# Patient Record
Sex: Female | Born: 1935
Health system: Southern US, Community
[De-identification: ages and names within clinical notes are randomized; demographics above are authoritative.]

## PROBLEM LIST (undated history)

## (undated) DIAGNOSIS — I10 Essential (primary) hypertension: Secondary | ICD-10-CM

## (undated) DIAGNOSIS — E785 Hyperlipidemia, unspecified: Secondary | ICD-10-CM

## (undated) DIAGNOSIS — E079 Disorder of thyroid, unspecified: Secondary | ICD-10-CM

## (undated) HISTORY — PX: TONSILLECTOMY: SUR1361

## (undated) HISTORY — PX: BACK SURGERY: SHX140

---

## 2007-12-18 ENCOUNTER — Inpatient Hospital Stay (HOSPITAL_COMMUNITY): Admission: EM | Admit: 2007-12-18 | Discharge: 2007-12-21 | Payer: Self-pay | Admitting: Emergency Medicine

## 2010-06-03 NOTE — Discharge Summary (Signed)
Jill Curtis, Jill Curtis               ACCOUNT NO.:  [id number]   MEDICAL RECORD NO.:  [id number]          PATIENT TYPE:  INP   LOCATION:  1518                         FACILITY:  [facility]   PHYSICIAN:  Monte Fantasia, MD  DATE OF BIRTH:  [birth year]   DATE OF ADMISSION:  12/18/2007  DATE OF DISCHARGE:                               DISCHARGE SUMMARY   PRIMARY CARE PHYSICIAN:  Dr. Marlou Sa [facility],  telephone number is [phone], fax number is [phone].   DISCHARGE DIAGNOSES:  1. Partial small obstruction, resolved.  2. Ileus, which is resolved.  3. Nausea, vomiting and abdominal pain possibly secondary to number 1      and number 2, which is also resolved.  4. Hypertension.  5. Mild hyponatremia, which is resolved.   DISCHARGE MEDICATIONS:  1. Diltiazem 120 mg p.o. daily.  2. Levothyroxine 125 mg p.o. daily.  3. Niacin 1000 mg p.o. q.h.s.  4. Protonix 40 mg p.o. q. 12.  5. The patient received Pneumococcal vaccine on December 19, 2007.   HOSPITAL COURSE:  A 77 year old Caucasian lady.  The patient came into  the emergency room with complaints of nausea, vomiting and abdominal  discomfort which was epigastric in location and worsened in intensity.  The patient was evaluated by the gastroenterologist on admission as her  guaiac was positive.  The impression was she did have a resolving small  bowel obstruction as well as melena; however, she would need an urgent  endoscopy on admission.  The patient was started on Proton pump  inhibitor therapy and was recommended to transfuse p.r.n. on admission  in case of a hemoglobin drop.  The patient was monitored for her  hemoglobin which remained stable for the past 3 days.  The patient also  symptomatically felt better and no longer had abdominal pain.  The  patient at present also had small bowel feedings done which showed no  evidence of any small bowel obstruction and just mild small bowel edema.  The patient  at present is stable as per GI and can be discharged home.   RADIOLOGICAL INVESTIGATIONS:  Done during the hospital stay:  CT scan of the abdomen and pelvis done on December 18, 2007.  Impression:  1. Partial bowel obstruction.  The distal ileum is decompressed so      obstruction is at the level of mid to distal ileum.  Actual      transitions were not visualized.  2. No free intraperitoneal air.  3. A small amount of ascites.  4. Mildly enlarged uterus containing numerous enhancing fibroids.   Small bowel series done on December 20, 2007.  Impression:  1. No current evidence of small bowel obstruction.  2. Multiple abdominal loops of small bowel showing nonspecific bowel      wall thickening.  3. Cecum more in the midline position than normal that is unlikely to      be of clinical significance.   DISCHARGE LABS:  Total WBC count 6.6, hemoglobin 12.9, hematocrit 37.6,  platelet count 176.  Sodium 135, potassium 4, chloride 107, bicarb 23,  glucose  112, BUN 6, creatinine 0.6, calcium 8.2, TSH 0.212.  UA has been  negative.  Hemoccult blood done on December 20, 2007 has been negative.   ASSESSMENT/PLAN:  I plan to discharge the patient today with the  discharge medications as dictated above.  Recommend the patient to  follow up with her primary care physician in [facility] on Friday.  Nutrition evaluation has been done and explained.  Education has been  done and a nutrition sheet has been provided to the patient.  We will  discharge the patient today.      Monte Fantasia, MD  Electronically Signed     MP/MEDQ  D:  12/21/2007  T:  12/21/2007  Job:  [id number]   cc:   Dr. Jethro Bastos

## 2010-06-03 NOTE — Consult Note (Signed)
[name], Curtis               ACCOUNT NO.:  [id number]   MEDICAL RECORD NO.:  [id number]          PATIENT TYPE:  INP   LOCATION:  1518                         FACILITY:  [facility]   PHYSICIAN:  Petra Kuba, M.D.    DATE OF BIRTH:  [birth year]   DATE OF CONSULTATION:  12/19/2007  DATE OF DISCHARGE:                                 CONSULTATION   We are asked to see Jill Curtis today in consultation for melena and heme-  positivity by Dr. Tamsen Roers of [facility].   HISTORY OF PRESENT ILLNESS:  This is a 77 year old female who was  vacationing in [place], [place] when she developed constipation  last Wednesday.  Normally her bowel movements are very regular and each  morning.  On Saturday she developed nausea and abdominal pain and then  began vomiting Saturday night.  She is uncertain if there was blood in  her emesis.  On Sunday the nausea, vomiting and emesis continued and she  came to [facility].  Yesterday she noticed a small black  liquid stool and then again today she had an urgent black liquid stool  in larger amount.  Her last colonoscopy was 3 years ago.  At that time  she had diverticulosis and 3 polyps that were benign.  Since being here  in the hospital,she has had no further abdominal pain and her emesis has  stopped.  She tells me that normally she has no abdominal pain, no  heartburn or indigestion.  She has not been on any recent antibiotic.  She does not take NSAIDs.  She is undergoing a treatment to prevent skin  cancers on her face.  She says she has a propensity to develop skin  cancers.   PAST MEDICAL HISTORY:  Significant for back surgery, tonsillectomy and  no abdominal surgeries.  She has a history of hypothyroidism, mild  hypertension and a propensity to develop skin cancers.   CURRENT MEDICATIONS:  Include Synthroid, Tiazac, a multivitamin, niacin,  magnesium and CoQ10.   She has no known drug allergies.   REVIEW OF SYSTEMS:  This  appears to be a healthy 77 year old female who  describes herself as active.  She describes no shortness of breath,  palpitations or weight loss.  No recent illness.   SOCIAL HISTORY:  Negative for tobacco and recreational drugs.  She  drinks an occasional drink of alcohol.   FAMILY HISTORY:  Negative for colon cancer.   PHYSICAL EXAM:  She is alert, pleasant to speak with.  Her temperature  is 97.7.  Pulse 95.  Respirations 18.  Blood pressure is 121/69.  Heart  has a regular rate and rhythm.  LUNGS:  Clear to auscultation.  ABDOMEN:  Has hyperactive bowel sounds at the moment.  It is soft,  mildly tender in the lower quadrants bilaterally.   LABORATORIES:  Significant for a hemoglobin of 12.4.  This is a drop  from 14.8, probably mostly delusional.  Her white count was 15.2 on  admission, is now 12.9.  Hematocrit 35.8.  BMET is significant for a  sodium of 130, BUN 13, potassium 3.5, creatinine 0.74, glucose 125.  CT  scan done yesterday shows a partial small bowel obstruction, small  amount of intraabdominal ascites, obstruction at the level of the mid to  distal ileum.  She has uterine fibroids.   ASSESSMENT:  Dr. Vida Rigger has seen and examined the patient, collected  a history and reviewed her chart.  His impression is that she does have  a resolving small bowel obstruction as well as melena.  Do not see a  need for urgent endoscopy today.  We will monitor her hemoglobin and  hematocrit.  Possible endoscopy tomorrow if she does not improve, agree  with proton pump inhibitor therapy, transfuse p.r.n.  We will follow  with you.  Thanks very much for this consultation.      Stephani Police, PA    ______________________________  Petra Kuba, M.D.    MLY/MEDQ  D:  12/19/2007  T:  12/19/2007  Job:  [id number]   cc:   Petra Kuba, M.D.  Fax: [phone]

## 2010-06-03 NOTE — H&P (Signed)
Jill Curtis, Jill Curtis               ACCOUNT NO.:  [id number]   MEDICAL RECORD NO.:  [id number]          PATIENT TYPE:  INP   LOCATION:  1518                         [facility]ACILITY:  [facility]   PHYSICIAN:  Della Goo, M.D. DATE OF BIRTH:  09/22/1929   DATE OF ADMISSION:  12/18/2007  DATE OF DISCHARGE:                              HISTORY & PHYSICAL   PRIMARY CARE PHYSICIAN:  Unassigned.  The patient is visiting from  [place].   CHIEF COMPLAINT:  Nausea, vomiting.   HISTORY OF PRESENT ILLNESS:  This is a 77 year old female who presents  to the emergency department with complaints of severe nausea and  vomiting over the past two days.  She reports having constipation for  the past two days as well.  Denies having any diarrhea or loose stool  passage.  Denies having any hematemesis or bilious emesis passage.  She  denies having any fevers, chills or congestion, chest pain, shortness of  breath.  The patient does report having abdominal discomfort which is  epigastric in location.  She states that the abdominal pain discomfort  was rated at 10/10.  However, was relieved with medications.  Her nausea  was also relieved with medications that were administered in the  emergency department.   PAST MEDICAL HISTORY:  Significant for hypothyroidism, hypertension,  diverticulitis in the past, hyperlipidemia, and skin cancer.   PAST SURGICAL HISTORY:  History of a previous back surgery about 20  years ago.   MEDICATIONS:  1. Synthroid 0.125 mg p.o. daily.  2. Tiazac 20 mg one p.o. daily.  3. Niacin 1000 mg one p.o. daily.  4. Over-the-counter supplements.  5. Coenzyme Q-10.  6. Multivitamin.  7. Vitamin D.  8. Magnesium.   ALLERGIES:  NO KNOWN DRUG ALLERGIES.   SOCIAL HISTORY:  The patient is married, lives in [place].  She is a  nonsmoker, nondrinker.   FAMILY HISTORY:  Noncontributory.  A 14-point review of systems  performed and the pertinent positives are mentioned above.   PHYSICAL EXAMINATION:  GENERAL:  This is a thin 77 year old well-  developed female in discomfort but no acute distress.  VITAL SIGNS:  Temperature 98.5, blood pressure 155/69, heart rate  initially 101 now 62, respirations 18, O2 sat 96-97% on room air.  HEENT:  Examination normocephalic, atraumatic.  Positive facial erythema  along the forehead and malar area (the patient reports this is a skin  treatment she has been undergoing for skin cancer by her dermatologist).  Pupils are equally round reactive to light.  Extraocular movements are  intact.  Funduscopic benign.  Oropharynx is clear.  NECK:  Supple, full range of motion.  No thyromegaly, adenopathy,  jugular venous distention.  CARDIOVASCULAR:  Regular rate and rhythm.  No murmurs, gallops or rubs.  LUNGS:  Clear to auscultation bilaterally.  ABDOMEN:  Decreased bowel sounds, soft, nontender, nondistended.  No  hepatosplenomegaly.  EXTREMITIES:  Without cyanosis, clubbing or edema.  NEUROLOGIC:  Examination nonfocal.   LABORATORY STUDIES:  White blood cell count 15.2, hemoglobin 14.8,  hematocrit 43.1, platelets 218.  Sodium 133, potassium 3.7, chloride 98,  bicarb  25, BUN 16, creatinine 0.81 and glucose 136, lipase 22.  Urinalysis negative.  CT scan of the abdomen and pelvis reveals dilated  small-bowel loops.  Small amount of ascites, small hiatal hernia a small  low attenuation focus mentioned in the anterior segment of the right  lobe of the liver.  CT scan of the pelvis does revealed a mildly  enlarged uterus with numerous enhancing fibroids, small amount of  ascites in the pelvis and mild degenerative changes.   ASSESSMENT:  A 77 year old female being admitted with:  1. Partial small-bowel obstruction.  2. Ileus.  3. Nausea and vomiting secondary #1 and #2.  4. Abdominal pain.  5. Hypertension.  6. Mild hyponatremia.   PLAN:  The patient will be admitted and placed on IV fluids and clear  liquids and bowel rest.   Antiemetic therapy and pain control therapy  have also been ordered along with IV Reglan therapy q.6 h.  The patient  will be monitored for further changes and a general surgery consultation  will be considered.  The patient will be placed on DVT and GI  prophylaxis as well and continue on her regular medications, withhold  parameters for hypotension with her blood pressure medication.      Della Goo, M.D.  Electronically Signed     HJ/MEDQ  D:  12/19/2007  T:  12/20/2007  Job:  [id number]

## 2010-10-21 LAB — COMPREHENSIVE METABOLIC PANEL
ALT: 19 U/L (ref 0–35)
Calcium: 9.3 mg/dL (ref 8.4–10.5)
Chloride: 98 mEq/L (ref 96–112)
Creatinine, Ser: 0.81 mg/dL (ref 0.4–1.2)
Potassium: 3.7 mEq/L (ref 3.5–5.1)
Sodium: 133 mEq/L — ABNORMAL LOW (ref 135–145)
Total Bilirubin: 1 mg/dL (ref 0.3–1.2)
Total Protein: 6.5 g/dL (ref 6.0–8.3)

## 2010-10-21 LAB — DIFFERENTIAL
Basophils Absolute: 0.1 10*3/uL (ref 0.0–0.1)
Basophils Relative: 1 % (ref 0–1)
Eosinophils Absolute: 0 10*3/uL (ref 0.0–0.7)
Eosinophils Absolute: 0 10*3/uL (ref 0.0–0.7)
Lymphocytes Relative: 16 % (ref 12–46)
Lymphocytes Relative: 9 % — ABNORMAL LOW (ref 12–46)
Lymphs Abs: 2.1 10*3/uL (ref 0.7–4.0)
Monocytes Absolute: 1 10*3/uL (ref 0.1–1.0)
Monocytes Relative: 4 % (ref 3–12)
Neutro Abs: 13.3 10*3/uL — ABNORMAL HIGH (ref 1.7–7.7)

## 2010-10-21 LAB — CBC
HCT: 35.8 % — ABNORMAL LOW (ref 36.0–46.0)
HCT: 43.1 % (ref 36.0–46.0)
Hemoglobin: 12.4 g/dL (ref 12.0–15.0)
Hemoglobin: 14.8 g/dL (ref 12.0–15.0)
MCHC: 34.2 g/dL (ref 30.0–36.0)
MCHC: 34.7 g/dL (ref 30.0–36.0)
MCV: 97 fL (ref 78.0–100.0)
MCV: 97.8 fL (ref 78.0–100.0)
Platelets: 218 10*3/uL (ref 150–400)
RBC: 4.44 MIL/uL (ref 3.87–5.11)
RDW: 12.6 % (ref 11.5–15.5)

## 2010-10-21 LAB — URINALYSIS, ROUTINE W REFLEX MICROSCOPIC
Bilirubin Urine: NEGATIVE
Glucose, UA: NEGATIVE mg/dL
Hgb urine dipstick: NEGATIVE
Ketones, ur: 15 mg/dL — AB
Nitrite: NEGATIVE
Protein, ur: NEGATIVE mg/dL
pH: 6.5 (ref 5.0–8.0)

## 2010-10-21 LAB — URINE CULTURE
Colony Count: NO GROWTH
Culture: NO GROWTH

## 2010-10-21 LAB — CROSSMATCH
ABO/RH(D): A POS
Antibody Screen: NEGATIVE

## 2010-10-21 LAB — BASIC METABOLIC PANEL
Calcium: 8.1 mg/dL — ABNORMAL LOW (ref 8.4–10.5)
Chloride: 102 mEq/L (ref 96–112)
GFR calc non Af Amer: >60 mL/min (ref >60)
Glucose, Bld: 125 mg/dL — ABNORMAL HIGH (ref 70–99)

## 2010-10-21 LAB — LIPASE, BLOOD: Lipase: 22 U/L (ref 11–59)

## 2010-10-24 LAB — CBC
MCHC: 34.3 g/dL (ref 30.0–36.0)
MCV: 98.4 fL (ref 78.0–100.0)
Platelets: 176 10*3/uL (ref 150–400)
RDW: 13 % (ref 11.5–15.5)
WBC: 6.6 10*3/uL (ref 4.0–10.5)

## 2010-10-24 LAB — BASIC METABOLIC PANEL
BUN: 6 mg/dL (ref 6–23)
Creatinine, Ser: 0.64 mg/dL (ref 0.4–1.2)
GFR calc Af Amer: >60 mL/min (ref >60)
Glucose, Bld: 112 mg/dL — ABNORMAL HIGH (ref 70–99)
Sodium: 135 mEq/L (ref 135–145)

## 2010-10-24 LAB — HEMOGLOBIN AND HEMATOCRIT, BLOOD
HCT: 37.6 % (ref 36.0–46.0)
HCT: 38.7 % (ref 36.0–46.0)
Hemoglobin: 11.7 g/dL — ABNORMAL LOW (ref 12.0–15.0)
Hemoglobin: 12.9 g/dL (ref 12.0–15.0)
Hemoglobin: 13 g/dL (ref 12.0–15.0)

## 2010-10-24 LAB — OCCULT BLOOD X 1 CARD TO LAB, STOOL: Fecal Occult Bld: NEGATIVE

## 2012-01-19 ENCOUNTER — Encounter (HOSPITAL_COMMUNITY): Payer: Self-pay | Admitting: *Deleted

## 2012-01-19 ENCOUNTER — Emergency Department (HOSPITAL_COMMUNITY)
Admission: EM | Admit: 2012-01-19 | Discharge: 2012-01-19 | Disposition: A | Discharge status: Home / Self Care | Payer: Medicare Other | Attending: Emergency Medicine | Admitting: Emergency Medicine

## 2012-01-19 ENCOUNTER — Emergency Department (HOSPITAL_COMMUNITY): Payer: Medicare Other

## 2012-01-19 DIAGNOSIS — Z7982 Long term (current) use of aspirin: Secondary | ICD-10-CM | POA: Insufficient documentation

## 2012-01-19 DIAGNOSIS — E079 Disorder of thyroid, unspecified: Secondary | ICD-10-CM | POA: Insufficient documentation

## 2012-01-19 DIAGNOSIS — R1031 Right lower quadrant pain: Secondary | ICD-10-CM | POA: Insufficient documentation

## 2012-01-19 DIAGNOSIS — I10 Essential (primary) hypertension: Secondary | ICD-10-CM | POA: Insufficient documentation

## 2012-01-19 DIAGNOSIS — N200 Calculus of kidney: Secondary | ICD-10-CM | POA: Insufficient documentation

## 2012-01-19 DIAGNOSIS — Z79899 Other long term (current) drug therapy: Secondary | ICD-10-CM | POA: Insufficient documentation

## 2012-01-19 DIAGNOSIS — E785 Hyperlipidemia, unspecified: Secondary | ICD-10-CM | POA: Insufficient documentation

## 2012-01-19 HISTORY — DX: Essential (primary) hypertension: I10

## 2012-01-19 HISTORY — DX: Hyperlipidemia, unspecified: E78.5

## 2012-01-19 HISTORY — DX: Disorder of thyroid, unspecified: E07.9

## 2012-01-19 LAB — COMPREHENSIVE METABOLIC PANEL
ALT: 20 U/L (ref 0–35)
AST: 25 U/L (ref 0–37)
Alkaline Phosphatase: 55 U/L (ref 39–117)
CO2: 23 mEq/L (ref 19–32)
Calcium: 9.1 mg/dL (ref 8.4–10.5)
GFR calc non Af Amer: 82 mL/min — ABNORMAL LOW (ref >90)
Glucose, Bld: 88 mg/dL (ref 70–99)
Potassium: 3.7 mEq/L (ref 3.5–5.1)
Sodium: 129 mEq/L — ABNORMAL LOW (ref 135–145)
Total Protein: 6.8 g/dL (ref 6.0–8.3)

## 2012-01-19 LAB — URINALYSIS, ROUTINE W REFLEX MICROSCOPIC
Leukocytes, UA: NEGATIVE
Nitrite: NEGATIVE
Protein, ur: NEGATIVE mg/dL
Specific Gravity, Urine: 1.009 (ref 1.005–1.030)
Urobilinogen, UA: 0.2 mg/dL (ref 0.0–1.0)

## 2012-01-19 LAB — CBC WITH DIFFERENTIAL/PLATELET
Basophils Absolute: 0.1 10*3/uL (ref 0.0–0.1)
Eosinophils Relative: 1 % (ref 0–5)
Lymphocytes Relative: 31 % (ref 12–46)
Lymphs Abs: 3.4 10*3/uL (ref 0.7–4.0)
MCV: 92.7 fL (ref 78.0–100.0)
Neutrophils Relative %: 60 % (ref 43–77)
Platelets: 161 10*3/uL (ref 150–400)
RBC: 4.25 MIL/uL (ref 3.87–5.11)
RDW: 12.6 % (ref 11.5–15.5)
WBC: 10.8 10*3/uL — ABNORMAL HIGH (ref 4.0–10.5)

## 2012-01-19 LAB — URINE MICROSCOPIC-ADD ON

## 2012-01-19 MED ORDER — MORPHINE SULFATE 2 MG/ML IJ SOLN
2.0000 mg | Freq: Once | INTRAMUSCULAR | Status: AC
  Administered 2012-01-19: 2 mg via INTRAVENOUS
  Filled 2012-01-19: qty 1

## 2012-01-19 MED ORDER — OXYCODONE-ACETAMINOPHEN 5-325 MG PO TABS: 2.0000 | ORAL_TABLET | ORAL | Status: DC | PRN

## 2012-01-19 MED ORDER — TAMSULOSIN HCL 0.4 MG PO CAPS: 0.4000 mg | ORAL_CAPSULE | Freq: Every day | ORAL | Status: DC

## 2012-01-19 MED ORDER — KETOROLAC TROMETHAMINE 30 MG/ML IJ SOLN
15.0000 mg | Freq: Once | INTRAMUSCULAR | Status: AC
  Administered 2012-01-19: 15 mg via INTRAVENOUS
  Filled 2012-01-19: qty 1

## 2012-01-19 MED ORDER — MORPHINE SULFATE 4 MG/ML IJ SOLN
4.0000 mg | Freq: Once | INTRAMUSCULAR | Status: AC
  Administered 2012-01-19: 4 mg via INTRAVENOUS
  Filled 2012-01-19: qty 1

## 2012-01-19 MED ORDER — ONDANSETRON HCL 4 MG/2ML IJ SOLN
4.0000 mg | Freq: Once | INTRAMUSCULAR | Status: AC
  Administered 2012-01-19: 4 mg via INTRAVENOUS
  Filled 2012-01-19: qty 2

## 2012-01-19 MED ORDER — MORPHINE SULFATE 2 MG/ML IJ SOLN: 2.0000 mg | Freq: Once | INTRAMUSCULAR | Status: DC

## 2012-01-19 NOTE — ED Provider Notes (Addendum)
History     CSN: [id number]  Arrival date & time 01/19/12  1513   First MD Initiated Contact with Patient 01/19/12 1719      Chief Complaint  Patient presents with  . Flank Pain    (Consider location/radiation/quality/duration/timing/severity/associated sxs/prior treatment) Patient is a 78 y.o. female presenting with flank pain. The history is provided by the patient.  Flank Pain This is a new problem. The current episode started 6 to 12 hours ago. Episode frequency: every several min. The problem has not changed since onset.Associated symptoms include abdominal pain. Pertinent negatives include no chest pain and no shortness of breath. Associated symptoms comments: No fever, dysuria. States that she is unsure if the pain has moved since it started at 3 AM this morning.. Nothing aggravates the symptoms. Nothing relieves the symptoms. Treatments tried: 2 aleve. The treatment provided no relief.    Past Medical History  Diagnosis Date  . Thyroid disease   . Hypertension   . Hyperlipidemia     Past Surgical History  Procedure Date  . Tonsillectomy   . Back surgery     History reviewed. No pertinent family history.  History  Substance Use Topics  . Smoking status: Never Smoker   . Smokeless tobacco: Not on file  . Alcohol Use: Yes    OB History    Grav Para Term Preterm Abortions TAB SAB Ect Mult Living                  Review of Systems  Constitutional: Negative for fever.  Respiratory: Negative for cough and shortness of breath.   Cardiovascular: Negative for chest pain.  Gastrointestinal: Positive for abdominal pain. Negative for nausea, vomiting and diarrhea.  Genitourinary: Positive for flank pain. Negative for dysuria and vaginal discharge.  All other systems reviewed and are negative.    Allergies  Review of patient's allergies indicates no known allergies.  Home Medications   Current Outpatient Rx  Name  Route  Sig  Dispense  Refill  . ASPIRIN 325  MG PO TABS   Oral   Take 325 mg by mouth at bedtime.         Marland Kitchen VITAMIN D 1000 UNITS PO TABS   Oral   Take 1,000 Units by mouth daily.         Marland Kitchen EZETIMIBE 10 MG PO TABS   Oral   Take 10 mg by mouth at bedtime.         Marland Kitchen LEVOTHYROXINE SODIUM 88 MCG PO TABS   Oral   Take 88 mcg by mouth daily.         Marland Kitchen MAGNESIUM 250 MG PO TABS   Oral   Take 1 tablet by mouth daily.         . NEBIVOLOL HCL 5 MG PO TABS   Oral   Take 5 mg by mouth daily.           BP 156/75  Pulse 67  Temp 98 F (36.7 C) (Oral)  Resp 16  SpO2 98%  Physical Exam  Nursing note and vitals reviewed. Constitutional: She is oriented to person, place, and time. She appears well-developed and well-nourished. No distress.  HENT:  Head: Normocephalic and atraumatic.  Mouth/Throat: Oropharynx is clear and moist.  Eyes: Conjunctivae normal and EOM are normal. Pupils are equal, round, and reactive to light.  Neck: Normal range of motion. Neck supple.  Cardiovascular: Normal rate, regular rhythm and intact distal pulses.   No murmur heard.  Pulmonary/Chest: Effort normal and breath sounds normal. No respiratory distress. She has no wheezes. She has no rales.  Abdominal: Soft. Normal appearance. She exhibits no distension. There is tenderness in the right lower quadrant. There is CVA tenderness. There is no rebound and no guarding.         Right flank pain  Musculoskeletal: Normal range of motion. She exhibits no edema and no tenderness.  Neurological: She is alert and oriented to person, place, and time.  Skin: Skin is warm and dry. No rash noted. No erythema.  Psychiatric: She has a normal mood and affect. Her behavior is normal.    ED Course  Procedures (including critical care time)  Labs Reviewed  URINALYSIS, ROUTINE W REFLEX MICROSCOPIC - Abnormal; Notable for the following:    Hgb urine dipstick TRACE (*)     Ketones, ur TRACE (*)     All other components within normal limits  CBC WITH  DIFFERENTIAL - Abnormal; Notable for the following:    WBC 10.8 (*)     All other components within normal limits  COMPREHENSIVE METABOLIC PANEL - Abnormal; Notable for the following:    Sodium 129 (*)     GFR calc non Af Amer 82 (*)     All other components within normal limits  URINE MICROSCOPIC-ADD ON   Ct Abdomen Pelvis Wo Contrast  01/19/2012  *RADIOLOGY REPORT*  Clinical Data: Right flank pain.  Question kidney stone.  CT ABDOMEN AND PELVIS WITHOUT CONTRAST  Technique:  Multidetector CT imaging of the abdomen and pelvis was performed following the standard protocol without intravenous contrast.  Comparison: 12/18/2007  Findings: 3 tiny pulmonary nodules are seen in the lung bases, but these are unchanged since the study of over 4 years ago, consistent with benign disease.  No focal abnormalities seen in the liver or spleen.  The stomach, duodenum, gallbladder, and adrenal glands are normal in appearance. Slight prominence of the main pancreatic duct is unchanged. Kidneys are unremarkable.  No secondary change in either kidney. No renal stones.  No evidence for stones in either ureter or the urinary bladder.  No abdominal aortic aneurysm.  There is no free fluid or lymphadenopathy in the abdomen.  Imaging through the pelvis shows no free intraperitoneal fluid. There is no pelvic sidewall lymphadenopathy.  Bladder is unremarkable.  Uterus has normal imaging features.  There is no adnexal mass.  Diverticular changes are seen in the sigmoid colon without diverticulitis.  The terminal ileum is normal. The appendix is not visualized, but there is no edema or inflammation in the region of the cecum.  Bilateral growing hernias contain only fat.  Bone windows show no worrisome lytic or sclerotic osseous abnormality.  Superior endplate compression deformity of L1 is stable.  IMPRESSION: No acute findings in the abdomen or pelvis.  Specifically, no evidence to explain the patient's history of right flank pain.    Original Report Authenticated By: Kennith Center, M.D.      1. Kidney stone       MDM   Pt with symptoms consistent with kidney stone.  Denies infectious sx, or GI symptoms.  Low concern for diverticulitis and no risk factors or history suggestive of AAA.  HD stable and normal pulses.  No hx suggestive of GU source (discharge).  Will hydrate, treat pain and ensure no infection with UA, CBC, CMP and will get stone study to further eval.  6:26 PM UA with signs of trace hb and no infection.  CBC  with mild leukocytosis of 11,000.  7:10 PM CMP within normal limits. CT stone study showed a normal aorta. No signs of appendicitis and no findings of kidney stones. Unclear what is causing the patient's pain however with trace blood in her urine it could be a passed stone. There's no sign of infection and no ovarian cyst or concerns. Bone lesions. Will have patient followup with PCP if pain continues.  7:40 PM On reevaluation patient was still having pain so she was given 2 more milligrams of morphine and will recheck. Still feel patient's symptoms are most consistent with a kidney stone and with the rest of her imaging being negative will treat as such.  8:55 PM Pt still having pain and given toradol and morphine  9:48 PM Moderate improvement with pain after last dose.  Will d/c home with meds and f/u.  Gwyneth Sprout, MD 01/19/12 1911  Gwyneth Sprout, MD 01/19/12 1941  Gwyneth Sprout, MD 01/19/12 4098  Gwyneth Sprout, MD 01/19/12 1191  Gwyneth Sprout, MD 01/19/12 2149

## 2012-01-19 NOTE — ED Notes (Addendum)
Pt reports right lower abdominal pain/ hip pain. intermitement pain 10/10 since 0200 this am. Denies nausea, vomiting, dysuria. No hx of kidney stones.

## 2014-05-29 IMAGING — CT CT ABD-PELV W/O CM
1 series · 14 of 19 positions shown, 19 images · non-contrast
Comparison: 12/18/2007

CLINICAL DATA: Right flank pain.  Question kidney stone.

CT ABDOMEN AND PELVIS WITHOUT CONTRAST
TECHNIQUE: Multidetector CT imaging of the abdomen and pelvis was
performed following the standard protocol without intravenous
contrast.

[Series 6: lung · axial · 0.74mm/px · z∈[-89,-9]mm · 14 of 19 slices shown, 19 images]
[im 2/19  soft-tissue]
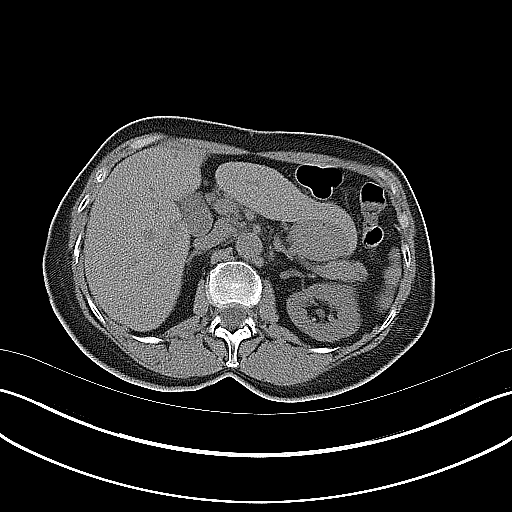
[im 2/19  bone]
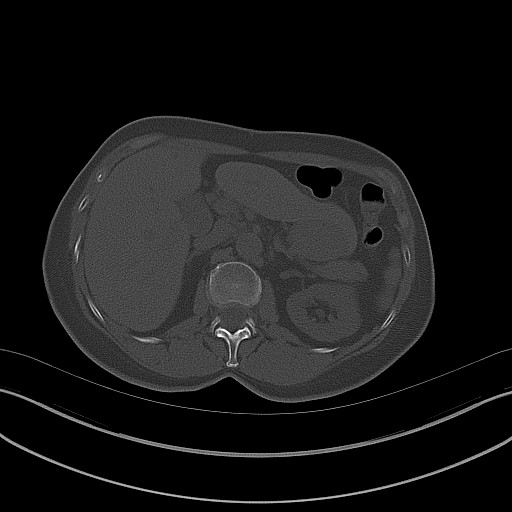
[im 3/19  soft-tissue]
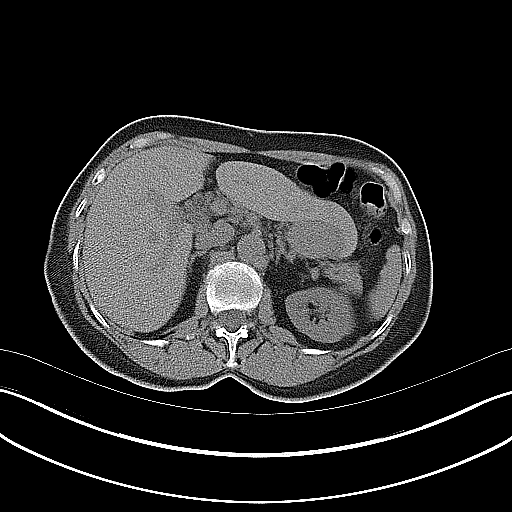
[im 5/19  soft-tissue]
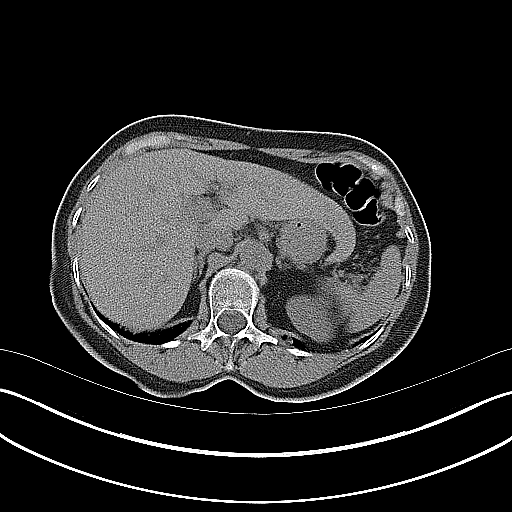
[im 6/19  soft-tissue]
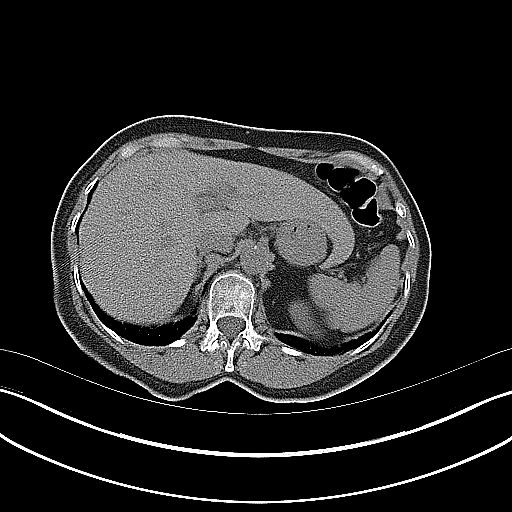
[im 7/19  soft-tissue]
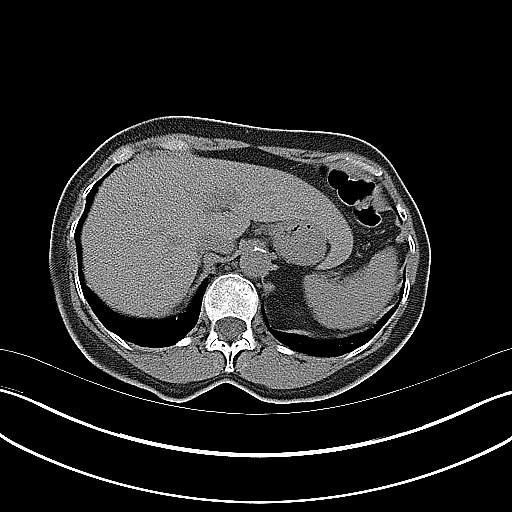
[im 9/19  soft-tissue]
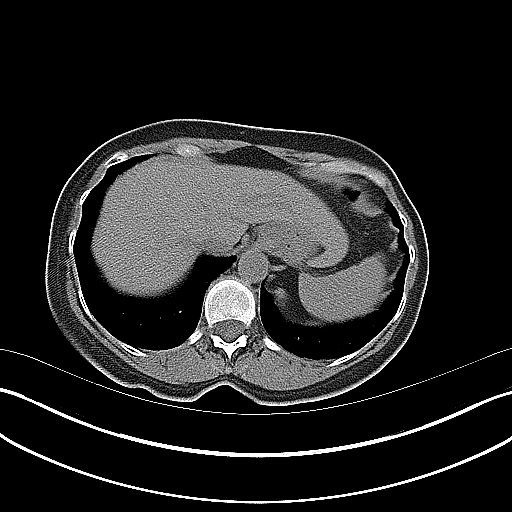
[im 10/19  soft-tissue]
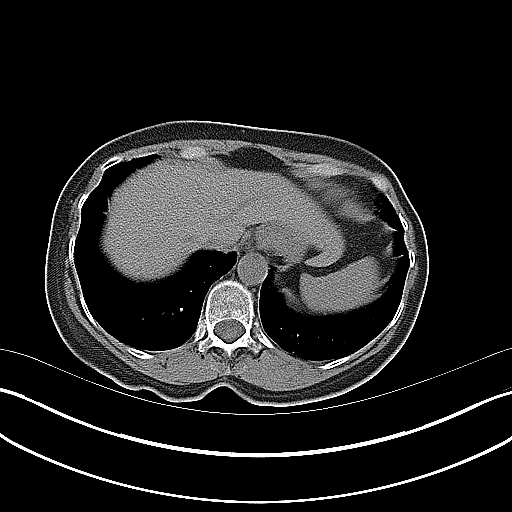
[im 11/19  soft-tissue]
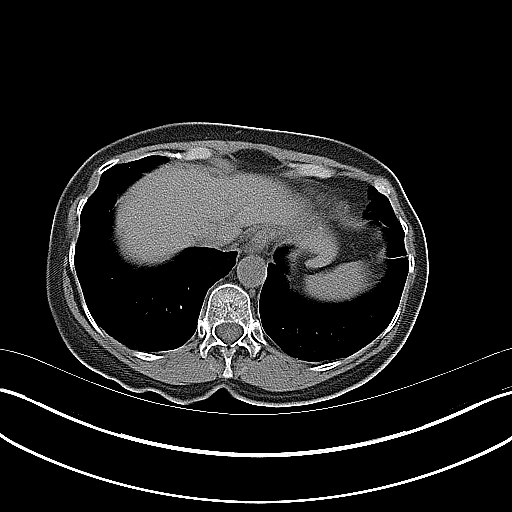
[im 13/19  soft-tissue]
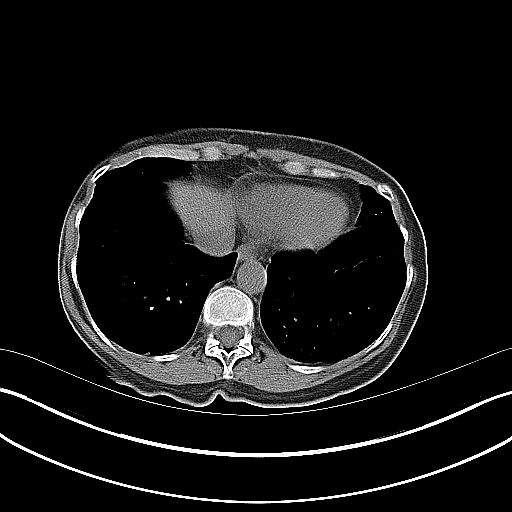
[im 13/19  bone]
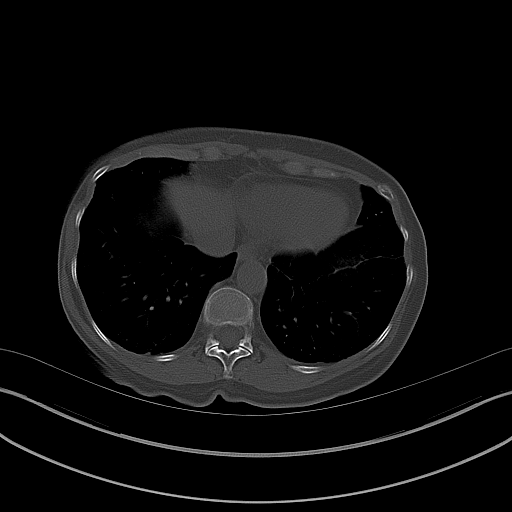
[im 14/19  soft-tissue]
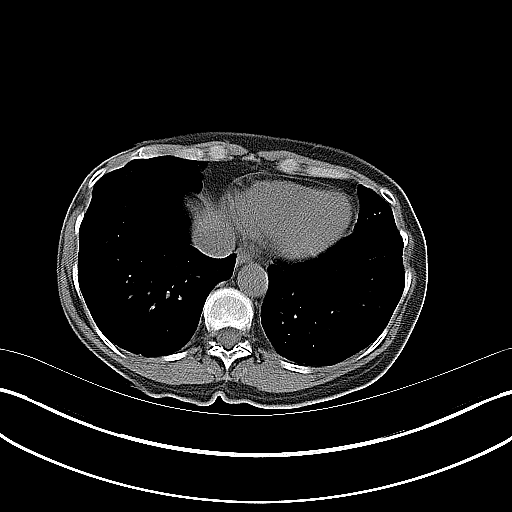
[im 15/19  soft-tissue]
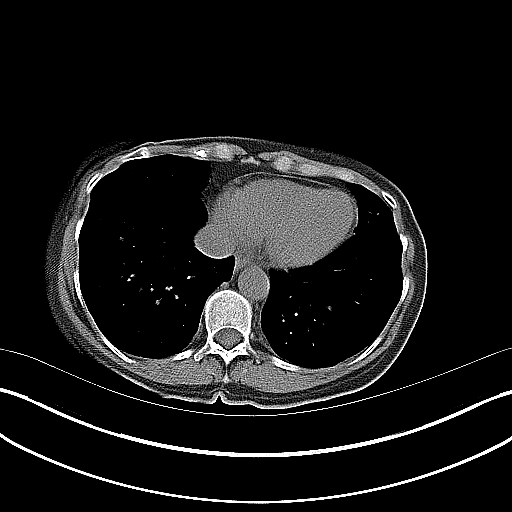
[im 15/19  lung]
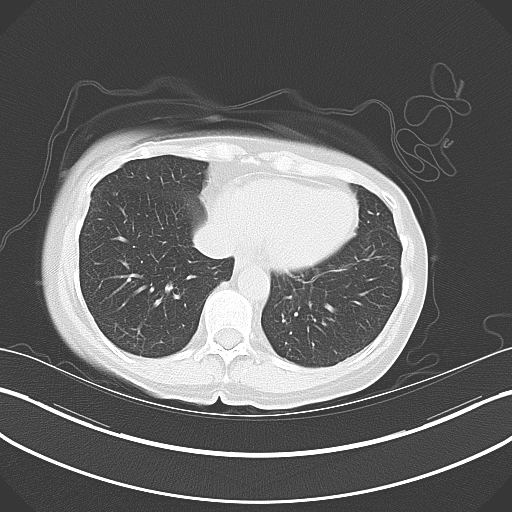
[im 16/19  lung]
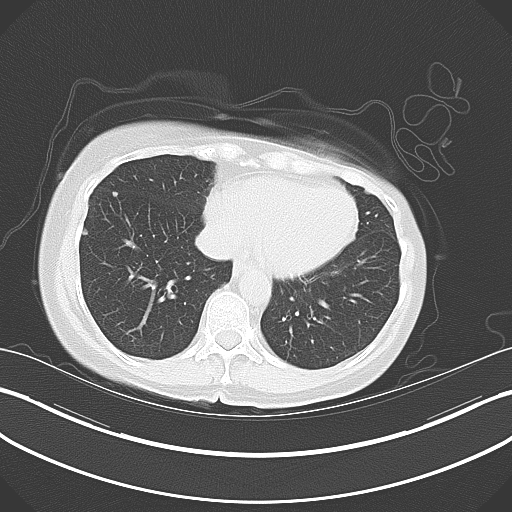
[im 17/19  soft-tissue]
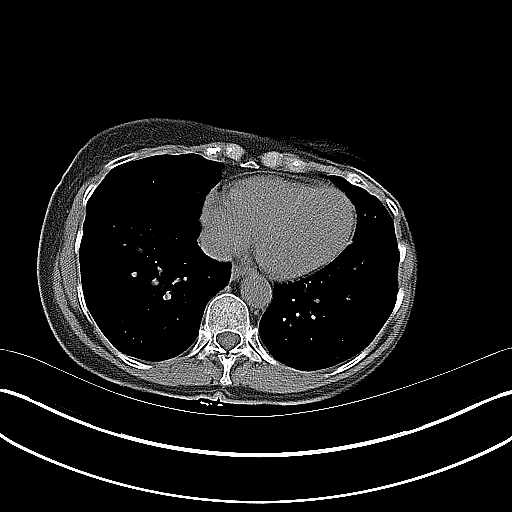
[im 17/19  lung]
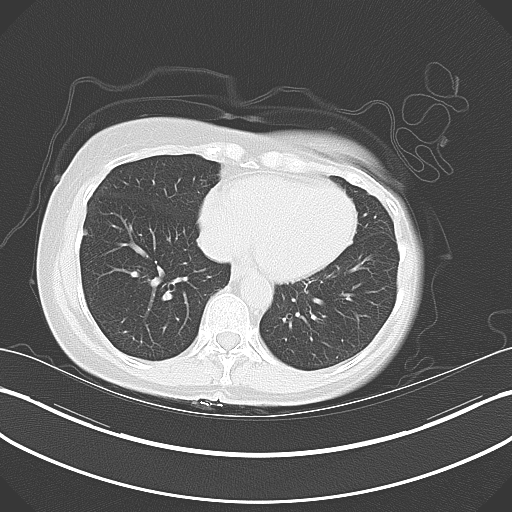
[im 18/19  soft-tissue]
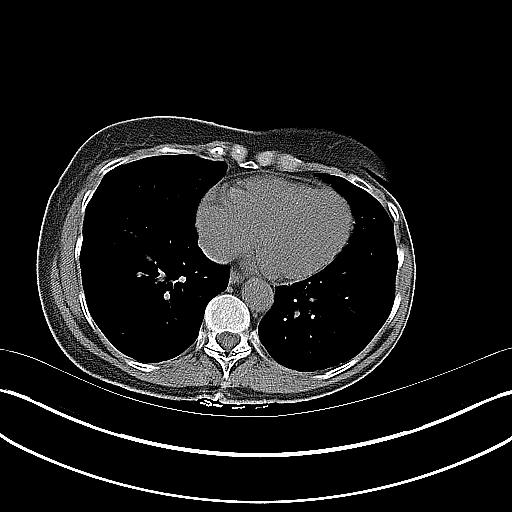
[im 18/19  lung]
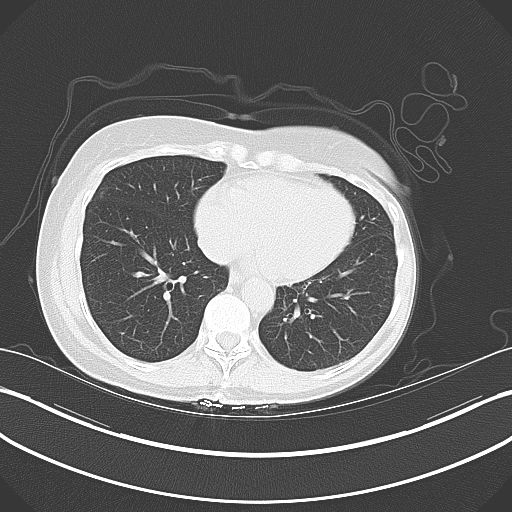

[14 of 19 positions shown; findings below may reference images not displayed]

FINDINGS: 3 tiny pulmonary nodules are seen in the lung bases, but
these are unchanged since the study of over 4 years ago, consistent
with benign disease.

No focal abnormalities seen in the liver or spleen.  The stomach,
duodenum, gallbladder, and adrenal glands are normal in appearance.
Slight prominence of the main pancreatic duct is unchanged.
Kidneys are unremarkable.  No secondary change in either kidney.
No renal stones.  No evidence for stones in either ureter or the
urinary bladder.

No abdominal aortic aneurysm.  There is no free fluid or
lymphadenopathy in the abdomen.

Imaging through the pelvis shows no free intraperitoneal fluid.
There is no pelvic sidewall lymphadenopathy.  Bladder is
unremarkable.  Uterus has normal imaging features.  There is no
adnexal mass.

Diverticular changes are seen in the sigmoid colon without
diverticulitis.  The terminal ileum is normal. The appendix is not
visualized, but there is no edema or inflammation in the region of
the cecum.

Bilateral growing hernias contain only fat.  Bone windows show no
worrisome lytic or sclerotic osseous abnormality.  Superior
endplate compression deformity of L1 is stable.
IMPRESSION: No acute findings in the abdomen or pelvis.  Specifically, no
evidence to explain the patient's history of right flank pain.

## 2018-11-07 ENCOUNTER — Other Ambulatory Visit: Payer: Self-pay

## 2018-11-07 DIAGNOSIS — Z20822 Contact with and (suspected) exposure to covid-19: Secondary | ICD-10-CM

## 2018-11-09 LAB — NOVEL CORONAVIRUS, NAA: SARS-CoV-2, NAA: DETECTED — AB

## 2019-07-03 ENCOUNTER — Telehealth: Payer: Self-pay | Admitting: Cardiology

## 2019-07-03 NOTE — Telephone Encounter (Signed)
I attempted to contact patient on 07/03/19 to schedule appointment from patients referral to Dr.Crenshaw. The patient didn't answer so I left message for patient to return call to get appointment scheduled.

## 2019-09-01 NOTE — Progress Notes (Signed)
Cardiology Office Note   Date:  09/04/2019   ID:  Liona Wengert, DOB 18-Aug-1929, MRN [id number]  PCP:  Eartha Inch, MD  Cardiologist:   Anastasios Melander [place], MD   Chief Complaint  Patient presents with  . Fatigue      History of Present Illness: Jill Curtis is a 86 y.o. female who is seen at the request of Dr Anne Hahn for complaints of visual changes, fatigue,  and left arm numbness. She has a history of HTN, HLD, and thyroid disease. She had prior event monitor in 2019 while in [place] showing a few runs of SVT. This was part of a work up for possible TIAs. She had remote stress testing in 2013 that was normal.  She tells me today she is not sure why she is here. It was recommended by her Neurologist. She reports that for several years she has had some dizziness. She has felt fatigued. She denies any palpitations, syncope, chest pain or SOB. She is very active and walks at least 2 miles per day. This past weekend she was in the [place] and took 2 long hikes. She has noted some visual changes in her right eye. Normal eye exam. MRI showed some chronic white matter changes. 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     Past Medical History:  Diagnosis Date  . Hyperlipidemia   . Hypertension   . Thyroid disease     Past Surgical History:  Procedure Laterality Date  . BACK SURGERY    . TONSILLECTOMY       Current Outpatient Medications  Medication Sig Dispense Refill  . cetirizine (ZYRTEC) 10 MG tablet Take 10 mg by mouth daily.    . cholecalciferol (VITAMIN D) 1000 UNITS tablet Take 1,000 Units by mouth daily.    Marland Kitchen ezetimibe (ZETIA) 10 MG tablet Take 10 mg by mouth at bedtime.    Marland Kitchen levothyroxine (SYNTHROID, LEVOTHROID) 88 MCG tablet Take 88 mcg by mouth daily.    Marland Kitchen lisinopril (ZESTRIL) 5 MG tablet Take by mouth.    . Magnesium 250 MG TABS Take 1 tablet by mouth daily.    . nebivolol (BYSTOLIC) 5 MG tablet Take 5 mg by mouth daily.    . rosuvastatin (CRESTOR) 5 MG tablet  Take by mouth.    . Tamsulosin HCl (FLOMAX) 0.4 MG CAPS Take 1 capsule (0.4 mg total) by mouth daily after supper. 5 capsule 0  . aspirin EC 81 MG tablet Take 1 tablet (81 mg total) by mouth daily. Swallow whole. 90 tablet 3   No current facility-administered medications for this visit.    Allergies:   Patient has no known allergies.    Social History:  The patient  reports that she has never smoked. She has never used smokeless tobacco. She reports current alcohol use of about 2.0 standard drinks of alcohol per week. She reports that she does not use drugs.   Family History:  The patient's family history is negative for CAD.   ROS:  Please see the history of present illness.   Otherwise, review of systems are positive for none.   All other systems are reviewed and negative.    PHYSICAL EXAM: VS:  BP (!) 168/94   Pulse 71   Ht 5' (1.524 m)   Wt 113 lb 9.6 oz (51.5 kg)   SpO2 96%   BMI 22.19 kg/m  , BMI Body mass index is 22.19 kg/m. GEN: Well nourished, well developed, WF appears younger than stated  age. in no acute distress  HEENT: normal  Neck: no JVD, carotid bruits, or masses Cardiac: RRR; no murmurs, rubs, or gallops,no edema  Respiratory:  clear to auscultation bilaterally, normal work of breathing GI: soft, nontender, nondistended, + BS MS: no deformity or atrophy  Skin: warm and dry, no rash Neuro:  Strength and sensation are intact Psych: euthymic mood, full affect   EKG:  EKG is ordered today. The ekg ordered today demonstrates NSR rate 71. Nonspecific TWA. I have personally reviewed and interpreted this study.    Recent Labs: No results found for requested labs within last 8760 hours.    Lipid Panel No results found for: CHOL, TRIG, HDL, CHOLHDL, VLDL, LDLCALC, LDLDIRECT    Wt Readings from Last 3 Encounters:  09/04/19 113 lb 9.6 oz (51.5 kg)    Labs dated 05/31/19: albumin 2.4, otherwise CMET normal. TSH 0.373. plts 52K otherwise CBC normal. Noted  platelet aggregation.  Dated 07/14/18: cholesterol 140, triglycerides 49, HDL 88, LDL 42.    Other studies Reviewed: Additional studies/ records that were reviewed today include: none Review of the above records demonstrates: none   ASSESSMENT AND PLAN:  1.  Fatigue. Nonspecific. Nothing on history or exam to indicate a cardiac etiology. No ischemic symptoms. She is very active.  2. Dizziness chronic. I request the results of event monitor from 2019.  3. HTN well controlled. 4. HLD well controlled.  Plan; I do not recommend further cardiac evaluation at this time. Exam and Ecg are benign. No significant findings to suggest she is having coronary ischemia. Will  Follow up prn.   Current medicines are reviewed at length with the patient today.  The patient does not have concerns regarding medicines.  The following changes have been made:  no change  Labs/ tests ordered today include: none  Orders Placed This Encounter  Procedures  . EKG 12-Lead     Disposition:   FU with me PRN  Signed, Brittnae Aschenbrenner [place], MD  09/04/2019 11:50 AM    [facility] [address] [place], [place], [address] Phone [phone], Fax [phone]

## 2019-09-04 ENCOUNTER — Other Ambulatory Visit: Payer: Self-pay

## 2019-09-04 ENCOUNTER — Ambulatory Visit (INDEPENDENT_AMBULATORY_CARE_PROVIDER_SITE_OTHER): Payer: Medicare Other | Admitting: Cardiology

## 2019-09-04 ENCOUNTER — Encounter: Payer: Self-pay | Admitting: Cardiology

## 2019-09-04 VITALS — BP 168/94 | HR 71 | Ht 60.0 in | Wt 113.6 lb

## 2019-09-04 DIAGNOSIS — E7800 Pure hypercholesterolemia, unspecified: Secondary | ICD-10-CM

## 2019-09-04 DIAGNOSIS — R5383 Other fatigue: Secondary | ICD-10-CM

## 2019-09-04 DIAGNOSIS — R42 Dizziness and giddiness: Secondary | ICD-10-CM | POA: Diagnosis not present

## 2019-09-04 DIAGNOSIS — I10 Essential (primary) hypertension: Secondary | ICD-10-CM | POA: Diagnosis not present

## 2019-09-04 MED ORDER — ASPIRIN EC 81 MG PO TBEC: 81.0000 mg | DELAYED_RELEASE_TABLET | Freq: Every day | ORAL | 3 refills | Status: DC

## 2019-10-16 ENCOUNTER — Other Ambulatory Visit (HOSPITAL_BASED_OUTPATIENT_CLINIC_OR_DEPARTMENT_OTHER): Payer: Self-pay | Admitting: Internal Medicine

## 2019-10-16 ENCOUNTER — Ambulatory Visit: Payer: Medicare Other | Attending: Internal Medicine

## 2019-10-16 DIAGNOSIS — Z23 Encounter for immunization: Secondary | ICD-10-CM

## 2019-10-16 MED FILL — FLUAD QUADRIVALENT 0.5 ML P: 0.5 | 1 days supply | Qty: 1 | Fill #0

## 2019-10-16 NOTE — Progress Notes (Signed)
   Covid-19 Vaccination Clinic  Name:  Jill Curtis    MRN: [id number] DOB: Aug 21, 1929  10/16/2019  [name] was observed post Covid-19 immunization for 15 minutes without incident. She was provided with Vaccine Information Sheet and instruction to access the V-Safe system. Vaccinated By: Dionisio David.  [name] was instructed to call 911 with any severe reactions post vaccine: Marland Kitchen Difficulty breathing  . Swelling of face and throat  . A fast heartbeat  . A bad rash all over body  . Dizziness and weakness

## 2019-10-24 MED FILL — PFIZER-BIONTECH COVID-19 VA: 30 | 1 days supply | Qty: 0 | Fill #0

## 2022-06-05 ENCOUNTER — Other Ambulatory Visit: Payer: Self-pay

## 2022-06-05 ENCOUNTER — Emergency Department (HOSPITAL_BASED_OUTPATIENT_CLINIC_OR_DEPARTMENT_OTHER): Payer: Medicare Other

## 2022-06-05 ENCOUNTER — Observation Stay (HOSPITAL_BASED_OUTPATIENT_CLINIC_OR_DEPARTMENT_OTHER)
Admission: EM | Admit: 2022-06-05 | Discharge: 2022-06-06 | Disposition: A | Discharge status: Home / Self Care | Payer: Medicare Other | Attending: Internal Medicine | Admitting: Internal Medicine

## 2022-06-05 ENCOUNTER — Encounter (HOSPITAL_BASED_OUTPATIENT_CLINIC_OR_DEPARTMENT_OTHER): Payer: Self-pay | Admitting: *Deleted

## 2022-06-05 DIAGNOSIS — R29898 Other symptoms and signs involving the musculoskeletal system: Secondary | ICD-10-CM

## 2022-06-05 DIAGNOSIS — Z7982 Long term (current) use of aspirin: Secondary | ICD-10-CM | POA: Insufficient documentation

## 2022-06-05 DIAGNOSIS — R739 Hyperglycemia, unspecified: Secondary | ICD-10-CM | POA: Diagnosis not present

## 2022-06-05 DIAGNOSIS — E039 Hypothyroidism, unspecified: Secondary | ICD-10-CM | POA: Insufficient documentation

## 2022-06-05 DIAGNOSIS — R531 Weakness: Secondary | ICD-10-CM

## 2022-06-05 DIAGNOSIS — E871 Hypo-osmolality and hyponatremia: Secondary | ICD-10-CM | POA: Diagnosis not present

## 2022-06-05 DIAGNOSIS — Z79899 Other long term (current) drug therapy: Secondary | ICD-10-CM | POA: Diagnosis not present

## 2022-06-05 DIAGNOSIS — E079 Disorder of thyroid, unspecified: Secondary | ICD-10-CM | POA: Diagnosis not present

## 2022-06-05 DIAGNOSIS — I10 Essential (primary) hypertension: Secondary | ICD-10-CM | POA: Diagnosis present

## 2022-06-05 DIAGNOSIS — E785 Hyperlipidemia, unspecified: Secondary | ICD-10-CM | POA: Diagnosis present

## 2022-06-05 DIAGNOSIS — R200 Anesthesia of skin: Secondary | ICD-10-CM | POA: Diagnosis present

## 2022-06-05 DIAGNOSIS — G459 Transient cerebral ischemic attack, unspecified: Principal | ICD-10-CM | POA: Diagnosis present

## 2022-06-05 LAB — DIFFERENTIAL
Abs Immature Granulocytes: 0.02 10*3/uL (ref 0.00–0.07)
Basophils Absolute: 0.1 10*3/uL (ref 0.0–0.1)
Basophils Relative: 1 %
Eosinophils Absolute: 0.1 10*3/uL (ref 0.0–0.5)
Eosinophils Relative: 1 %
Immature Granulocytes: 0 %
Lymphocytes Relative: 27 %
Lymphs Abs: 1.9 10*3/uL (ref 0.7–4.0)
Monocytes Absolute: 0.6 10*3/uL (ref 0.1–1.0)
Monocytes Relative: 9 %
Neutro Abs: 4.3 10*3/uL (ref 1.7–7.7)
Neutrophils Relative %: 62 %

## 2022-06-05 LAB — CBC
HCT: 35.4 % — ABNORMAL LOW (ref 36.0–46.0)
Hemoglobin: 12.1 g/dL (ref 12.0–15.0)
MCH: 32.4 pg (ref 26.0–34.0)
MCHC: 34.2 g/dL (ref 30.0–36.0)
MCV: 94.9 fL (ref 80.0–100.0)
Platelets: 155 10*3/uL (ref 150–400)
RBC: 3.73 MIL/uL — ABNORMAL LOW (ref 3.87–5.11)
RDW: 12.5 % (ref 11.5–15.5)
WBC: 7 10*3/uL (ref 4.0–10.5)
nRBC: 0 % (ref 0.0–0.2)

## 2022-06-05 LAB — URINALYSIS, ROUTINE W REFLEX MICROSCOPIC
Bilirubin Urine: NEGATIVE
Glucose, UA: NEGATIVE mg/dL
Hgb urine dipstick: NEGATIVE
Ketones, ur: NEGATIVE mg/dL
Leukocytes,Ua: NEGATIVE
Nitrite: NEGATIVE
Protein, ur: NEGATIVE mg/dL
Specific Gravity, Urine: <1.005 — ABNORMAL LOW (ref 1.005–1.030)
pH: 7 (ref 5.0–8.0)

## 2022-06-05 LAB — COMPREHENSIVE METABOLIC PANEL
ALT: 15 U/L (ref 0–44)
AST: 18 U/L (ref 15–41)
Albumin: 4.1 g/dL (ref 3.5–5.0)
Alkaline Phosphatase: 36 U/L — ABNORMAL LOW (ref 38–126)
Anion gap: 8 (ref 5–15)
BUN: 16 mg/dL (ref 8–23)
CO2: 25 mmol/L (ref 22–32)
Calcium: 9 mg/dL (ref 8.9–10.3)
Chloride: 100 mmol/L (ref 98–111)
Creatinine, Ser: 0.73 mg/dL (ref 0.44–1.00)
GFR, Estimated: >60 mL/min (ref >60)
Glucose, Bld: 112 mg/dL — ABNORMAL HIGH (ref 70–99)
Potassium: 3.7 mmol/L (ref 3.5–5.1)
Sodium: 133 mmol/L — ABNORMAL LOW (ref 135–145)
Total Bilirubin: 0.4 mg/dL (ref 0.3–1.2)
Total Protein: 6 g/dL — ABNORMAL LOW (ref 6.5–8.1)

## 2022-06-05 LAB — TSH: TSH: 4.832 u[IU]/mL — ABNORMAL HIGH (ref 0.350–4.500)

## 2022-06-05 LAB — TROPONIN I (HIGH SENSITIVITY)
Troponin I (High Sensitivity): 2 ng/L (ref <18)
Troponin I (High Sensitivity): 4 ng/L (ref <18)

## 2022-06-05 MED ORDER — ROSUVASTATIN CALCIUM 5 MG PO TABS
5.0000 mg | ORAL_TABLET | Freq: Every day | ORAL | Status: DC
  Administered 2022-06-06: 5 mg via ORAL
  Filled 2022-06-05: qty 1

## 2022-06-05 MED ORDER — SODIUM CHLORIDE 0.9% FLUSH: 3.0000 mL | INTRAVENOUS | Status: DC | PRN

## 2022-06-05 MED ORDER — LISINOPRIL 2.5 MG PO TABS
5.0000 mg | ORAL_TABLET | Freq: Every day | ORAL | Status: DC
  Administered 2022-06-06: 5 mg via ORAL
  Filled 2022-06-05: qty 2

## 2022-06-05 MED ORDER — SODIUM CHLORIDE 0.9 % IV SOLN: 250.0000 mL | INTRAVENOUS | Status: DC | PRN

## 2022-06-05 MED ORDER — ACETAMINOPHEN 160 MG/5ML PO SOLN: 650.0000 mg | ORAL | Status: DC | PRN

## 2022-06-05 MED ORDER — SENNOSIDES-DOCUSATE SODIUM 8.6-50 MG PO TABS: 1.0000 | ORAL_TABLET | Freq: Every evening | ORAL | Status: DC | PRN

## 2022-06-05 MED ORDER — SODIUM CHLORIDE 0.9% FLUSH
3.0000 mL | Freq: Two times a day (BID) | INTRAVENOUS | Status: DC
  Administered 2022-06-06: 3 mL via INTRAVENOUS

## 2022-06-05 MED ORDER — ACETAMINOPHEN 325 MG PO TABS: 650.0000 mg | ORAL_TABLET | ORAL | Status: DC | PRN

## 2022-06-05 MED ORDER — ENOXAPARIN SODIUM 40 MG/0.4ML IJ SOSY
40.0000 mg | PREFILLED_SYRINGE | INTRAMUSCULAR | Status: DC
  Administered 2022-06-06: 40 mg via SUBCUTANEOUS
  Filled 2022-06-05: qty 0.4

## 2022-06-05 MED ORDER — LEVOTHYROXINE SODIUM 88 MCG PO TABS
88.0000 ug | ORAL_TABLET | Freq: Every day | ORAL | Status: DC
  Administered 2022-06-06: 88 ug via ORAL
  Filled 2022-06-05: qty 1

## 2022-06-05 MED ORDER — ACETAMINOPHEN 650 MG RE SUPP: 650.0000 mg | RECTAL | Status: DC | PRN

## 2022-06-05 MED ORDER — ASPIRIN 81 MG PO TBEC
81.0000 mg | DELAYED_RELEASE_TABLET | Freq: Every day | ORAL | Status: DC
  Administered 2022-06-06: 81 mg via ORAL
  Filled 2022-06-05: qty 1

## 2022-06-05 MED ORDER — IOHEXOL 350 MG/ML SOLN
100.0000 mL | Freq: Once | INTRAVENOUS | Status: AC | PRN
  Administered 2022-06-05: 75 mL via INTRAVENOUS

## 2022-06-05 MED ORDER — STROKE: EARLY STAGES OF RECOVERY BOOK
Freq: Once | Status: AC
  Filled 2022-06-05: qty 1

## 2022-06-05 NOTE — H&P (Signed)
History and Physical    Jill Curtis ZOX:[id number] DOB: [birth year] DOA: 06/05/2022  PCP: Eartha Inch, MD   Patient coming from: Home   Chief Complaint: Blurred vision, left hand numbness and weakness   HPI: Jill Curtis is a pleasant 89 y.o. female with medical history significant for hypertension, hyperlipidemia, and hypothyroidism who presents to the emergency department after an episode of blurred vision and left hand numbness and weakness.  Patient reports that she was feeling more fatigued than usual yesterday and then had an episode of blurred vision involving both eyes.  She also noted that her left arm "felt dead" from the elbow to the hand.  Blurred vision and left arm symptoms resolved after 30 minutes to an hour.  She now feels anxious and wonders if her heart is skipping beats.  She denies chest pain or lightheadedness.  [facility] ED Course: Upon arrival to the ED, patient is found to be afebrile and saturating well on room air with normal heart rate and stable blood pressure.  MRI brain is negative for acute intracranial abnormality.  Labs are notable for slightly low sodium and slightly elevated TSH.  Neurology was consulted by the ED physician and the patient was transferred to [facility] for TIA workup.  Review of Systems:  All other systems reviewed and apart from HPI, are negative.  Past Medical History:  Diagnosis Date   Hyperlipidemia    Hypertension    Thyroid disease     Past Surgical History:  Procedure Laterality Date   BACK SURGERY     TONSILLECTOMY      Social History:   reports that she has never smoked. She has never used smokeless tobacco. She reports current alcohol use of about 2.0 standard drinks of alcohol per week. She reports that she does not use drugs.  No Known Allergies  History reviewed. No pertinent family history.   Prior to Admission medications   Medication Sig Start Date End Date Taking? Authorizing  Provider  aspirin EC 81 MG tablet Take 1 tablet (81 mg total) by mouth daily. Swallow whole. 09/04/19   [place], Peter M, MD  cetirizine (ZYRTEC) 10 MG tablet Take 10 mg by mouth daily. 03/12/19   [provider]  cholecalciferol (VITAMIN D) 1000 UNITS tablet Take 1,000 Units by mouth daily.    [provider]  ezetimibe (ZETIA) 10 MG tablet Take 10 mg by mouth at bedtime.    [provider]  levothyroxine (SYNTHROID, LEVOTHROID) 88 MCG tablet Take 88 mcg by mouth daily.    [provider]  lisinopril (ZESTRIL) 5 MG tablet Take by mouth. 06/14/18   [provider]  Magnesium 250 MG TABS Take 1 tablet by mouth daily.    [provider]  nebivolol (BYSTOLIC) 5 MG tablet Take 5 mg by mouth daily.    [provider]  rosuvastatin (CRESTOR) 5 MG tablet Take by mouth. 06/14/18   [provider]  Tamsulosin HCl (FLOMAX) 0.4 MG CAPS Take 1 capsule (0.4 mg total) by mouth daily after supper. 01/19/12   Gwyneth Sprout, MD    Physical Exam: Vitals:   06/05/22 2020 06/05/22 2100 06/05/22 2120 06/05/22 2219  BP: (!) 175/86 (!) 154/93 (!) 160/80 (!) 164/73  Pulse: 69 76 71 71  Resp: 12 20 15 17   Temp:    97.7 F (36.5 C)  TempSrc:    Oral  SpO2: 98% 98% 98% 98%  Weight:      Height:  Constitutional: NAD, no pallor or diaphoresis   Eyes: PERTLA, lids and conjunctivae normal ENMT: Mucous membranes are moist. Posterior pharynx clear of any exudate or lesions.   Neck: supple, no masses  Respiratory: no wheezing, no crackles. No accessory muscle use.  Cardiovascular: S1 & S2 heard, regular rate and rhythm. No JVD. Abdomen: No distension, no tenderness, soft. Bowel sounds active.  Musculoskeletal: no clubbing / cyanosis. No joint deformity upper and lower extremities.   Skin: no significant rashes, lesions, ulcers. Warm, dry, well-perfused. Neurologic: CN 2-12 grossly intact. Sensation intact. Strength 5/5 in all 4 limbs.  Alert and oriented.  Psychiatric: Pleasant. Cooperative.    Labs and Imaging on Admission: I have personally reviewed following labs and imaging studies  CBC: Recent Labs  Lab 06/05/22 1711  WBC 7.0  NEUTROABS 4.3  HGB 12.1  HCT 35.4*  MCV 94.9  PLT 155   Basic Metabolic Panel: Recent Labs  Lab 06/05/22 1711  NA 133*  K 3.7  CL 100  CO2 25  GLUCOSE 112*  BUN 16  CREATININE 0.73  CALCIUM 9.0   GFR: Estimated Creatinine Clearance: 33.6 mL/min (by C-G formula based on SCr of 0.73 mg/dL). Liver Function Tests: Recent Labs  Lab 06/05/22 1711  AST 18  ALT 15  ALKPHOS 36*  BILITOT 0.4  PROT 6.0*  ALBUMIN 4.1   No results for input(s): "LIPASE", "AMYLASE" in the last 168 hours. No results for input(s): "AMMONIA" in the last 168 hours. Coagulation Profile: No results for input(s): "INR", "PROTIME" in the last 168 hours. Cardiac Enzymes: No results for input(s): "CKTOTAL", "CKMB", "CKMBINDEX", "TROPONINI" in the last 168 hours. BNP (last 3 results) No results for input(s): "PROBNP" in the last 8760 hours. HbA1C: No results for input(s): "HGBA1C" in the last 72 hours. CBG: No results for input(s): "GLUCAP" in the last 168 hours. Lipid Profile: No results for input(s): "CHOL", "HDL", "LDLCALC", "TRIG", "CHOLHDL", "LDLDIRECT" in the last 72 hours. Thyroid Function Tests: Recent Labs    06/05/22 1711  TSH 4.832*   Anemia Panel: No results for input(s): "VITAMINB12", "FOLATE", "FERRITIN", "TIBC", "IRON", "RETICCTPCT" in the last 72 hours. Urine analysis:    Component Value Date/Time   COLORURINE COLORLESS (A) 06/05/2022 1756   APPEARANCEUR CLEAR 06/05/2022 1756   LABSPEC <1.005 (L) 06/05/2022 1756   PHURINE 7.0 06/05/2022 1756   GLUCOSEU NEGATIVE 06/05/2022 1756   HGBUR NEGATIVE 06/05/2022 1756   BILIRUBINUR NEGATIVE 06/05/2022 1756   KETONESUR NEGATIVE 06/05/2022 1756   PROTEINUR NEGATIVE 06/05/2022 1756   UROBILINOGEN 0.2 01/19/2012 1652   NITRITE  NEGATIVE 06/05/2022 1756   LEUKOCYTESUR NEGATIVE 06/05/2022 1756   Sepsis Labs: @LABRCNTIP (procalcitonin:4,lacticidven:4) )No results found for this or any previous visit (from the past 240 hour(s)).   Radiological Exams on Admission: CT ANGIO HEAD NECK W WO CM  Result Date: 06/05/2022 CLINICAL DATA:  Initial evaluation for transient visual disturbance. EXAM: CT ANGIOGRAPHY HEAD AND NECK WITH AND WITHOUT CONTRAST TECHNIQUE: Multidetector CT imaging of the head and neck was performed using the standard protocol during bolus administration of intravenous contrast. Multiplanar CT image reconstructions and MIPs were obtained to evaluate the vascular anatomy. Carotid stenosis measurements (when applicable) are obtained utilizing NASCET criteria, using the distal internal carotid diameter as the denominator. RADIATION DOSE REDUCTION: This exam was performed according to the departmental dose-optimization program which includes automated exposure control, adjustment of the mA and/or kV according to patient size and/or use of iterative reconstruction technique. CONTRAST:  75mL OMNIPAQUE IOHEXOL 350 MG/ML SOLN  COMPARISON:  MRI from the same day. FINDINGS: CT HEAD FINDINGS Brain: Cerebral and within normal limits. Mild chronic microvascular ischemic disease. No acute intracranial hemorrhage. No acute large vessel territory infarct. No mass lesion or midline shift. No hydrocephalus or extra-axial fluid collection. Vascular: No abnormal hyperdense vessel. Skull: Scalp soft tissues and calvarium demonstrate no acute finding. Sinuses/Orbits: Globes and orbital soft tissues within normal limits. Paranasal sinuses and mastoid air cells are clear. Other: None. Review of the MIP images confirms the above findings CTA NECK FINDINGS Aortic arch: Normal caliber with standard branch pattern. Mild atheromatous change about the arch itself. No stenosis about the origin the great vessels. Right carotid system: Right common and  internal carotid arteries are patent without stenosis or dissection. Left carotid system: Left common and internal carotid arteries are patent without stenosis or dissection. Vertebral arteries: Both vertebral arteries arise from subclavian arteries. No proximal subclavian artery stenosis. Left vertebral artery slightly dominant. Vertebral arteries are patent without significant stenosis or dissection. Skeleton: No discrete or worrisome osseous lesions. Advanced spondylosis present at C4-5 through C6-7. Degenerative changes about the TMJs. Other neck: No other acute finding within the neck. Upper chest: No other acute finding. Review of the MIP images confirms the above findings CTA HEAD FINDINGS Anterior circulation: Both internal carotid arteries widely patent to the termini without stenosis. A1 segments widely patent. Normal anterior communicating artery complex. Both anterior cerebral arteries widely patent to their distal aspects without stenosis. No M1 stenosis or occlusion. Normal MCA bifurcations. Distal MCA branches well perfused and symmetric. Posterior circulation: Both vertebral arteries patent without stenosis. Neither PICA well visualized. Basilar patent without stenosis. Superior cerebellar arteries patent bilaterally. Both PCAs primarily supplied via the basilar. Mild atheromatous irregularity about the PCAs with associated short-segment mild distal left P2 stenosis (series 16, image 28). PCAs otherwise patent without significant stenosis. Venous sinuses: Patent allowing for timing the contrast bolus. Anatomic variants: None significant.  No aneurysm. Review of the MIP images confirms the above findings IMPRESSION: 1. Negative CTA of the head and neck. No large vessel occlusion or other emergent finding. No hemodynamically significant or correctable stenosis. 2. No other acute intracranial abnormality. 3. Mild chronic microvascular ischemic disease. Aortic Atherosclerosis (ICD10-I70.0).  Electronically Signed   By: Rise Mu M.D.   On: 06/05/2022 19:21   MR BRAIN WO CONTRAST  Result Date: 06/05/2022 CLINICAL DATA:  Initial evaluation for change in visual disturbance. EXAM: MRI HEAD WITHOUT CONTRAST TECHNIQUE: Multiplanar, multiecho pulse sequences of the brain and surrounding structures were obtained without intravenous contrast. COMPARISON:  None Available. FINDINGS: Brain: Cerebral volume within normal limits. Mild chronic microvascular ischemic disease for age. No acute or subacute ischemia. No areas of chronic cortical infarction. No acute intracranial hemorrhage. Multiple scattered chronic micro hemorrhages noted involving the right greater than left cerebral hemispheres, nonspecific. No mass lesion, midline shift or mass effect. No hydrocephalus or extra-axial fluid collection. Pituitary gland and suprasellar region within normal limits. Vascular: Major intracranial vascular flow voids are maintained. Skull and upper cervical spine: Craniocervical junction normal. Advanced spondylosis noted at C4-5 without high-grade spinal stenosis. Bone marrow signal intensity normal. No scalp soft tissue abnormality. Sinuses/Orbits: Prior bilateral ocular lens replacement. Paranasal sinuses and mastoid air cells are largely clear. Other: None. IMPRESSION: 1. No acute intracranial abnormality. 2. Mild chronic microvascular ischemic disease for age. 3. There is multiple scattered chronic micro hemorrhages involving the right greater than left cerebral hemispheres, nonspecific, with differential considerations including changes of chronic hypertension or  cerebral amyloid angiopathy. Electronically Signed   By: Rise Mu M.D.   On: 06/05/2022 19:09   DG Chest Portable 1 View  Result Date: 06/05/2022 CLINICAL DATA:  Left-sided weakness EXAM: PORTABLE CHEST 1 VIEW COMPARISON:  None Available. FINDINGS: No acute airspace disease. Normal cardiomediastinal silhouette. Mild diffuse  coarse interstitial opacity likely due to chronic change. Aortic atherosclerosis. No pneumothorax. IMPRESSION: No active disease. Electronically Signed   By: Jasmine Pang M.D.   On: 06/05/2022 17:55    EKG: Independently reviewed. Sinus rhythm.   Assessment/Plan  1. TIA  - Presents after an episode of blurred vision and distal LUE numbness and weakness   - No acute findings on MRI or CTA head & neck   - Continue cardiac monitoring and neuro checks, check echocardiogram, lipids, and A1c, consult PT/OT/SLP, continue ASA and statin, follow-up on any additional neurology recommendations    2. Hypertension  - Continue lisinopril    3. Hypothyroidism  - TSH was checked in ED and slightly elevated  - Check free T4, continue current dose of Synthroid for now     DVT prophylaxis: Lovenox  Code Status: Full  Level of Care: Level of care: Telemetry Medical Family Communication: None present  Disposition Plan:  Patient is from: Home  Anticipated d/c is to: Home  Anticipated d/c date is: 5/18 or 06/07/22  Patient currently: Pending TIA workup  Consults called: neurology  Admission status: Observation     Briscoe Deutscher, MD [facility]  06/06/2022, 2:34 AM

## 2022-06-05 NOTE — ED Provider Notes (Signed)
[place] EMERGENCY DEPARTMENT AT [facility] Provider Note   CSN: [id number] Arrival date & time: 06/05/22  1639     History {Add pertinent medical, surgical, social history, OB history to HPI:1} Chief Complaint  Patient presents with   Blurred Vision   Dizziness    Pam Finnigan is a 89 y.o. female.  89 year old female with a history of SVT, hypertension, thrombocytopenia, hypothyroidism, and reported strokes without deficits who presents emergency department with left arm numbness and weakness and vision changes.  At 2 PM the patient was signing a check and reported that her vision became very blurry out of both eyes.  Denies any diplopia, dysphagia, or difficulty speaking.  Says that she also noticed that her left arm.  Numb from the elbow down and was weak and she was having difficulty holding things.  Symptoms persisted until approximately 4:15 PM when her son picked her up and they had resolved.  Also reported generalized weakness that been going on since this morning.  Denies any dizziness to me aside from just feeling weak all over.  Chart says that she does have a history of strokes with last MRI that she had did not show any signs of large ischemic strokes just microvascular disease.  Not on blood thinners.       Home Medications Prior to Admission medications   Medication Sig Start Date End Date Taking? Authorizing Provider  aspirin EC 81 MG tablet Take 1 tablet (81 mg total) by mouth daily. Swallow whole. 09/04/19   [place], Peter M, MD  cetirizine (ZYRTEC) 10 MG tablet Take 10 mg by mouth daily. 03/12/19   [provider]  cholecalciferol (VITAMIN D) 1000 UNITS tablet Take 1,000 Units by mouth daily.    [provider]  ezetimibe (ZETIA) 10 MG tablet Take 10 mg by mouth at bedtime.    [provider]  levothyroxine (SYNTHROID, LEVOTHROID) 88 MCG tablet Take 88 mcg by mouth daily.    [provider]  lisinopril (ZESTRIL) 5 MG  tablet Take by mouth. 06/14/18   [provider]  Magnesium 250 MG TABS Take 1 tablet by mouth daily.    [provider]  nebivolol (BYSTOLIC) 5 MG tablet Take 5 mg by mouth daily.    [provider]  rosuvastatin (CRESTOR) 5 MG tablet Take by mouth. 06/14/18   [provider]  Tamsulosin HCl (FLOMAX) 0.4 MG CAPS Take 1 capsule (0.4 mg total) by mouth daily after supper. 01/19/12   Gwyneth Sprout, MD      Allergies    Patient has no known allergies.    Review of Systems   Review of Systems  Physical Exam Updated Vital Signs BP (!) 168/77   Pulse 69   Resp 16   Ht 5' (1.524 m)   Wt 52.2 kg   SpO2 100%   BMI 22.46 kg/m  Physical Exam Vitals and nursing note reviewed.  Constitutional:      General: She is not in acute distress.    Appearance: She is well-developed.  HENT:     Head: Normocephalic and atraumatic.     Right Ear: External ear normal.     Left Ear: External ear normal.     Nose: Nose normal.  Eyes:     Extraocular Movements: Extraocular movements intact.     Conjunctiva/sclera: Conjunctivae normal.     Pupils: Pupils are equal, round, and reactive to light.  Cardiovascular:     Rate and Rhythm: Normal rate and  regular rhythm.     Heart sounds: No murmur heard. Pulmonary:     Effort: Pulmonary effort is normal. No respiratory distress.     Breath sounds: Normal breath sounds.  Musculoskeletal:     Cervical back: Normal range of motion and neck supple.     Right lower leg: No edema.     Left lower leg: No edema.  Skin:    General: Skin is warm and dry.  Neurological:     Mental Status: She is alert and oriented to person, place, and time. Mental status is at baseline.     Comments: MENTAL STATUS: AAOx3 CRANIAL NERVES: II: Pupils equal and reactive 4 mm BL, no RAPD, no VF deficits III, IV, VI: EOM intact, no gaze preference or deviation, no nystagmus. V: normal sensation to light touch in V1, V2, and V3 segments  bilaterally VII: no facial weakness or asymmetry, no nasolabial fold flattening VIII: normal hearing to speech and finger friction IX, X: normal palatal elevation, no uvular deviation XI: 5/5 head turn and 5/5 shoulder shrug bilaterally XII: midline tongue protrusion MOTOR: 5/5 strength in R shoulder flexion, elbow flexion and extension, and grip strength. 5/5 strength in L shoulder flexion, elbow flexion and extension, and grip strength.  5/5 strength in R hip and knee flexion, knee extension, ankle plantar and dorsiflexion. 5/5 strength in L hip and knee flexion, knee extension, ankle plantar and dorsiflexion. SENSORY: Normal sensation to light touch in all extremities COORD: Normal finger to nose and heel to shin, no tremor, no dysmetria STATION: no truncal ataxia  Psychiatric:        Mood and Affect: Mood normal.     ED Results / Procedures / Treatments   Labs (all labs ordered are listed, but only abnormal results are displayed) Labs Reviewed  CBC - Abnormal; Notable for the following components:      Result Value   RBC 3.73 (*)    HCT 35.4 (*)    All other components within normal limits  DIFFERENTIAL  COMPREHENSIVE METABOLIC PANEL  URINALYSIS, ROUTINE W REFLEX MICROSCOPIC  TSH  CBG MONITORING, ED  TROPONIN I (HIGH SENSITIVITY)    EKG None  Radiology No results found.  Procedures Procedures  {Document cardiac monitor, telemetry assessment procedure when appropriate:1}  Medications Ordered in ED Medications - No data to display  ED Course/ Medical Decision Making/ A&P   {   Click here for ABCD2, HEART and other calculatorsREFRESH Note before signing :1}                          Medical Decision Making Amount and/or Complexity of Data Reviewed Labs: ordered. Radiology: ordered.   Thiya Agudelo is a 89 y.o. female with comorbidities that complicate the patient evaluation including ***   Initial Ddx:  ***   MDM:  ***  Plan:  ***  ED  Summary/Re-evaluation:  ***  This patient presents to the ED for concern of complaints listed in HPI, this involves an extensive number of treatment options, and is a complaint that carries with it a high risk of complications and morbidity. Disposition including potential need for admission considered.   Dispo: {Disposition:28069}  Additional history obtained from {Additional History:28067} Records reviewed {Records Reviewed:28068} The following labs were independently interpreted: {labs interpreted:28064} and show {lab findings:28250} I independently reviewed the following imaging with scope of interpretation limited to determining acute life threatening conditions related to emergency care: {imaging interpreted:28065} and agree with  the radiologist interpretation with the following exceptions: none I personally reviewed and interpreted cardiac monitoring: {cardiac monitoring:28251} I personally reviewed and interpreted the pt's EKG: see above for interpretation  I have reviewed the patients home medications and made adjustments as needed Consults: {Consultants:28063} Social Determinants of health:  Elderly  {Document critical care time when appropriate:1} {Document review of labs and clinical decision tools ie heart score, Chads2Vasc2 etc:1}  {Document your independent review of radiology images, and any outside records:1} {Document your discussion with family members, caretakers, and with consultants:1} {Document social determinants of health affecting pt's care:1} {Document your decision making why or why not admission, treatments were needed:1} Final Clinical Impression(s) / ED Diagnoses Final diagnoses:  None    Rx / DC Orders ED Discharge Orders     None

## 2022-06-05 NOTE — ED Notes (Signed)
Patient up to rest room, tolerated well, family at bedside. Awaiting disposition

## 2022-06-05 NOTE — Progress Notes (Signed)
Hospitalist Transfer Note:  Transferring facility: [facility] Requesting provider: Dr. Eloise Harman (EDP at [facility]) Reason for transfer: admission for further evaluation and management of TIA.    89  year old female with medical history notable for HTN, who presented to [facility] ED complaining of blurry vision in the bilateral eyes as well as left arm weakness/numbness, all of which started acutely on the afternoon of 06/05/2022, approximately 2 hours prior to presenting to [facility] emergency department.  All of the above spontaneously resolved, without any objective evidence of residual acute focal neurologic deficits on exam.  MRI brain showed no evidence of acute process.  EDP discussed patient's case with on-call neurology, Dr. Otelia Limes, Who recommended [facility] admission to [facility] for further TIA workup.  He conveyed that neurology will formally consult at [facility].  Subsequently, I accepted this patient for transfer for observation to a med-tele bed at [facility] for further work-up and management of the above .       Nursing staff, Please call [facility] Admits & Consults System-Wide number on Amion [phone]) as soon as patient's arrival, so appropriate admitting provider can evaluate the pt.     Newton Pigg, DO Hospitalist

## 2022-06-05 NOTE — ED Notes (Signed)
Patient transported to MRI 

## 2022-06-05 NOTE — Consult Note (Signed)
NEURO HOSPITALIST CONSULT NOTE   Requestig physician: Dr. Arlean Hopping  Reason for Consult: Transient binocular blurred vision, dizziness and left hand numbness with weakness  History obtained from:  Patient and Chart     HPI:                                                                                                                                          Jill Curtis is an 89 y.o. female with a PMHx of HLD, HTN and thyroid disease who presented to the ED on Friday afternoon with new onset of binocular blurred vision, dizziness and left hand numbness with weakness. Her symptoms lasted for approximately 1-2 hours and then spontaneously resolved. She stated that her left arm "felt dead" from the elbow to the hand. Symptoms began around 1:30 PM. The patient reiterates that the blurred vision was in both of her eyes and involved the entirety of her visual fields. After admission, she felt anxious and had been wondering if her heart was skipping beats. Labs showed mild hyponatremia and mildly elevated TSH. Heart rate was normal and BP was stable. She was afebrile and saturating well on RA.   Home medications include ASA and rosuvastatin.   Past Medical History:  Diagnosis Date   Hyperlipidemia    Hypertension    Thyroid disease     Past Surgical History:  Procedure Laterality Date   BACK SURGERY     TONSILLECTOMY      History reviewed. No pertinent family history.            Social History:  reports that she has never smoked. She has never used smokeless tobacco. She reports current alcohol use of about 2.0 standard drinks of alcohol per week. She reports that she does not use drugs.  No Known Allergies  MEDICATIONS:                                                                                                                     Prior to Admission:  Medications Prior to Admission  Medication Sig Dispense Refill Last Dose   aspirin EC 81 MG tablet Take 1  tablet (81 mg total) by mouth daily. Swallow whole. 90 tablet 3    cetirizine (ZYRTEC) 10 MG tablet Take 10 mg  by mouth daily.      cholecalciferol (VITAMIN D) 1000 UNITS tablet Take 1,000 Units by mouth daily.      ezetimibe (ZETIA) 10 MG tablet Take 10 mg by mouth at bedtime.      levothyroxine (SYNTHROID, LEVOTHROID) 88 MCG tablet Take 88 mcg by mouth daily.      lisinopril (ZESTRIL) 5 MG tablet Take by mouth.      Magnesium 250 MG TABS Take 1 tablet by mouth daily.      nebivolol (BYSTOLIC) 5 MG tablet Take 5 mg by mouth daily.      rosuvastatin (CRESTOR) 5 MG tablet Take by mouth.      Tamsulosin HCl (FLOMAX) 0.4 MG CAPS Take 1 capsule (0.4 mg total) by mouth daily after supper. 5 capsule 0       ROS:                                                                                                                                       Not currently with any headache. No CP, SOB or fevers. Other ROS as per HPI.    Blood pressure (!) 164/73, pulse 71, temperature 97.7 F (36.5 C), temperature source Oral, resp. rate 17, height 5' (1.524 m), weight 52.2 kg, SpO2 98 %.   General Examination:                                                                                                       Physical Exam  HEENT-  [place]/AT    Lungs- Respirations unlabored Extremities- No edema  Neurological Examination Mental Status: Alert, oriented x 5, thought content appropriate.  Speech fluent without evidence of aphasia. No dysarthria. Able to follow all commands without difficulty. Cranial Nerves: II: Temporal visual fields intact with no extinction to DSS. PERRL  III,IV, VI: No ptosis. EOMI.  V: Temp sensation equal bilaterally  VII: Smile symmetric VIII: Hearing intact to voice IX,X: No hoarseness XI: Symmetric shoulder shrug XII: Midline tongue extension Motor: BUE 5/5 proximally and distally BLE 5/5 proximally and distally  No pronator drift.  Sensory: Temp and light touch intact  throughout, bilaterally. No extinction to DSS.  Deep Tendon Reflexes: 1+ and symmetric throughout Cerebellar: No ataxia with FNF bilaterally  Gait: Able to stand with own power. Negative Romberg   Lab Results: Basic Metabolic Panel: Recent Labs  Lab 06/05/22 1711  NA 133*  K 3.7  CL 100  CO2 25  GLUCOSE 112*  BUN 16  CREATININE 0.73  CALCIUM 9.0    CBC: Recent Labs  Lab 06/05/22 1711  WBC 7.0  NEUTROABS 4.3  HGB 12.1  HCT 35.4*  MCV 94.9  PLT 155    Cardiac Enzymes: No results for input(s): "CKTOTAL", "CKMB", "CKMBINDEX", "TROPONINI" in the last 168 hours.  Lipid Panel: No results for input(s): "CHOL", "TRIG", "HDL", "CHOLHDL", "VLDL", "LDLCALC" in the last 168 hours.  Imaging: CT ANGIO HEAD NECK W WO CM  Result Date: 06/05/2022 CLINICAL DATA:  Initial evaluation for transient visual disturbance. EXAM: CT ANGIOGRAPHY HEAD AND NECK WITH AND WITHOUT CONTRAST TECHNIQUE: Multidetector CT imaging of the head and neck was performed using the standard protocol during bolus administration of intravenous contrast. Multiplanar CT image reconstructions and MIPs were obtained to evaluate the vascular anatomy. Carotid stenosis measurements (when applicable) are obtained utilizing NASCET criteria, using the distal internal carotid diameter as the denominator. RADIATION DOSE REDUCTION: This exam was performed according to the departmental dose-optimization program which includes automated exposure control, adjustment of the mA and/or kV according to patient size and/or use of iterative reconstruction technique. CONTRAST:  75mL OMNIPAQUE IOHEXOL 350 MG/ML SOLN COMPARISON:  MRI from the same day. FINDINGS: CT HEAD FINDINGS Brain: Cerebral and within normal limits. Mild chronic microvascular ischemic disease. No acute intracranial hemorrhage. No acute large vessel territory infarct. No mass lesion or midline shift. No hydrocephalus or extra-axial fluid collection. Vascular: No abnormal  hyperdense vessel. Skull: Scalp soft tissues and calvarium demonstrate no acute finding. Sinuses/Orbits: Globes and orbital soft tissues within normal limits. Paranasal sinuses and mastoid air cells are clear. Other: None. Review of the MIP images confirms the above findings CTA NECK FINDINGS Aortic arch: Normal caliber with standard branch pattern. Mild atheromatous change about the arch itself. No stenosis about the origin the great vessels. Right carotid system: Right common and internal carotid arteries are patent without stenosis or dissection. Left carotid system: Left common and internal carotid arteries are patent without stenosis or dissection. Vertebral arteries: Both vertebral arteries arise from subclavian arteries. No proximal subclavian artery stenosis. Left vertebral artery slightly dominant. Vertebral arteries are patent without significant stenosis or dissection. Skeleton: No discrete or worrisome osseous lesions. Advanced spondylosis present at C4-5 through C6-7. Degenerative changes about the TMJs. Other neck: No other acute finding within the neck. Upper chest: No other acute finding. Review of the MIP images confirms the above findings CTA HEAD FINDINGS Anterior circulation: Both internal carotid arteries widely patent to the termini without stenosis. A1 segments widely patent. Normal anterior communicating artery complex. Both anterior cerebral arteries widely patent to their distal aspects without stenosis. No M1 stenosis or occlusion. Normal MCA bifurcations. Distal MCA branches well perfused and symmetric. Posterior circulation: Both vertebral arteries patent without stenosis. Neither PICA well visualized. Basilar patent without stenosis. Superior cerebellar arteries patent bilaterally. Both PCAs primarily supplied via the basilar. Mild atheromatous irregularity about the PCAs with associated short-segment mild distal left P2 stenosis (series 16, image 28). PCAs otherwise patent without  significant stenosis. Venous sinuses: Patent allowing for timing the contrast bolus. Anatomic variants: None significant.  No aneurysm. Review of the MIP images confirms the above findings IMPRESSION: 1. Negative CTA of the head and neck. No large vessel occlusion or other emergent finding. No hemodynamically significant or correctable stenosis. 2. No other acute intracranial abnormality. 3. Mild chronic microvascular ischemic disease. Aortic Atherosclerosis (ICD10-I70.0). Electronically Signed   By: Rise Mu M.D.   On: 06/05/2022 19:21   MR BRAIN WO CONTRAST  Result Date: 06/05/2022 CLINICAL DATA:  Initial evaluation for change in visual disturbance. EXAM: MRI HEAD WITHOUT CONTRAST TECHNIQUE: Multiplanar, multiecho pulse sequences of the brain and surrounding structures were obtained without intravenous contrast. COMPARISON:  None Available. FINDINGS: Brain: Cerebral volume within normal limits. Mild chronic microvascular ischemic disease for age. No acute or subacute ischemia. No areas of chronic cortical infarction. No acute intracranial hemorrhage. Multiple scattered chronic micro hemorrhages noted involving the right greater than left cerebral hemispheres, nonspecific. No mass lesion, midline shift or mass effect. No hydrocephalus or extra-axial fluid collection. Pituitary gland and suprasellar region within normal limits. Vascular: Major intracranial vascular flow voids are maintained. Skull and upper cervical spine: Craniocervical junction normal. Advanced spondylosis noted at C4-5 without high-grade spinal stenosis. Bone marrow signal intensity normal. No scalp soft tissue abnormality. Sinuses/Orbits: Prior bilateral ocular lens replacement. Paranasal sinuses and mastoid air cells are largely clear. Other: None. IMPRESSION: 1. No acute intracranial abnormality. 2. Mild chronic microvascular ischemic disease for age. 3. There is multiple scattered chronic micro hemorrhages involving the right  greater than left cerebral hemispheres, nonspecific, with differential considerations including changes of chronic hypertension or cerebral amyloid angiopathy. Electronically Signed   By: Rise Mu M.D.   On: 06/05/2022 19:09   DG Chest Portable 1 View  Result Date: 06/05/2022 CLINICAL DATA:  Left-sided weakness EXAM: PORTABLE CHEST 1 VIEW COMPARISON:  None Available. FINDINGS: No acute airspace disease. Normal cardiomediastinal silhouette. Mild diffuse coarse interstitial opacity likely due to chronic change. Aortic atherosclerosis. No pneumothorax. IMPRESSION: No active disease. Electronically Signed   By: Jasmine Pang M.D.   On: 06/05/2022 17:55     Assessment: 89 year old female presenting with transient binocular blurred vision, dizziness and left hand numbness with weakness - Exam is nonfocal - CTA head and neck: Negative CTA of the head and neck. No large vessel occlusion or other emergent finding. No hemodynamically significant or correctable stenosis. No acute intracranial abnormality. Mild chronic microvascular ischemic disease. Aortic Atherosclerosis. - MRI brain: No acute intracranial abnormality. Mild chronic microvascular ischemic disease for age. Multiple scattered chronic micro hemorrhages involving the right greater than left cerebral hemispheres are nonspecific, with differential considerations including changes of chronic hypertension or cerebral amyloid angiopathy. - DDx for her presenting symptoms includes TIA but the blurred vision would be atypical. Stress-related symptoms are also on the DDx.   Recommendations: 1. HgbA1c, fasting lipid panel 2. Cardiac telemetry 3. PT consult, OT consult, Speech consult 4. Echocardiogram 5. Continue ASA and rosuvastatin 6. Frequent neuro checks 7. NPO until passes stroke swallow screen   Electronically signed: Dr. Caryl Pina 06/05/2022, 11:31 PM

## 2022-06-05 NOTE — ED Triage Notes (Addendum)
Pt states that she has been feeling tired and generally weak all day.  She states that she had blurred vision and dizziness lasted 1-2 hours and has now resolved, pt also reports weakness and numbness in left hand which is resolved.  Pt feels that this began around 1:30 and the symptoms have resolved.   Pt reports that the blurred vision was in both eyes. EDP in room speaking with pt.

## 2022-06-06 ENCOUNTER — Observation Stay (HOSPITAL_BASED_OUTPATIENT_CLINIC_OR_DEPARTMENT_OTHER): Payer: Medicare Other

## 2022-06-06 DIAGNOSIS — I10 Essential (primary) hypertension: Secondary | ICD-10-CM | POA: Diagnosis not present

## 2022-06-06 DIAGNOSIS — E785 Hyperlipidemia, unspecified: Secondary | ICD-10-CM | POA: Diagnosis not present

## 2022-06-06 DIAGNOSIS — G459 Transient cerebral ischemic attack, unspecified: Secondary | ICD-10-CM | POA: Diagnosis not present

## 2022-06-06 DIAGNOSIS — E079 Disorder of thyroid, unspecified: Secondary | ICD-10-CM | POA: Diagnosis not present

## 2022-06-06 LAB — LIPID PANEL
Cholesterol: 166 mg/dL (ref 0–200)
HDL: 74 mg/dL (ref >40)
LDL Cholesterol: 84 mg/dL (ref 0–99)
Total CHOL/HDL Ratio: 2.2 RATIO
Triglycerides: 42 mg/dL (ref <150)
VLDL: 8 mg/dL (ref 0–40)

## 2022-06-06 LAB — ECHOCARDIOGRAM COMPLETE
Area-P 1/2: 3.31 cm2
Height: 60 in
S' Lateral: 2 cm
Weight: 1840 oz

## 2022-06-06 LAB — CBC
HCT: 35.5 % — ABNORMAL LOW (ref 36.0–46.0)
Hemoglobin: 12.5 g/dL (ref 12.0–15.0)
MCH: 32.3 pg (ref 26.0–34.0)
MCHC: 35.2 g/dL (ref 30.0–36.0)
MCV: 91.7 fL (ref 80.0–100.0)
Platelets: 161 10*3/uL (ref 150–400)
RBC: 3.87 MIL/uL (ref 3.87–5.11)
RDW: 12.7 % (ref 11.5–15.5)
WBC: 7.4 10*3/uL (ref 4.0–10.5)
nRBC: 0 % (ref 0.0–0.2)

## 2022-06-06 LAB — BASIC METABOLIC PANEL
Anion gap: 9 (ref 5–15)
BUN: 10 mg/dL (ref 8–23)
CO2: 24 mmol/L (ref 22–32)
Calcium: 8.9 mg/dL (ref 8.9–10.3)
Chloride: 105 mmol/L (ref 98–111)
Creatinine, Ser: 0.7 mg/dL (ref 0.44–1.00)
GFR, Estimated: >60 mL/min (ref >60)
Glucose, Bld: 106 mg/dL — ABNORMAL HIGH (ref 70–99)
Potassium: 3.6 mmol/L (ref 3.5–5.1)
Sodium: 138 mmol/L (ref 135–145)

## 2022-06-06 LAB — T4, FREE: Free T4: 0.79 ng/dL (ref 0.61–1.12)

## 2022-06-06 LAB — HEMOGLOBIN A1C
Hgb A1c MFr Bld: 5.6 % (ref 4.8–5.6)
Mean Plasma Glucose: 114.02 mg/dL

## 2022-06-06 MED ORDER — CLOPIDOGREL BISULFATE 75 MG PO TABS
75.0000 mg | ORAL_TABLET | Freq: Every day | ORAL | Status: DC
  Administered 2022-06-06: 75 mg via ORAL
  Filled 2022-06-06: qty 1

## 2022-06-06 MED ORDER — CLOPIDOGREL BISULFATE 75 MG PO TABS: 75.0000 mg | ORAL_TABLET | Freq: Every day | ORAL | 0 refills | Status: DC

## 2022-06-06 MED ORDER — ROSUVASTATIN CALCIUM 10 MG PO TABS: 10.0000 mg | ORAL_TABLET | Freq: Every day | ORAL | 0 refills | Status: DC

## 2022-06-06 MED ORDER — ROSUVASTATIN CALCIUM 5 MG PO TABS: 10.0000 mg | ORAL_TABLET | Freq: Every day | ORAL | Status: DC

## 2022-06-06 NOTE — Progress Notes (Signed)
OT Cancellation Note  Patient Details Name: Jill Curtis MRN: [id number] DOB: [birth year]   Cancelled Treatment:    Reason Eval/Treat Not Completed: OT screened, no needs identified, will sign off (pt functioning at baseline. Reports of mild fuzzy vision in both eyes although vision overall is functional. No Acute OT needs at this time. Thank you for the referral.  Limmie Patricia, OTR/L,CBIS  Supplemental OT - [facility] Secure Chat Preferred   06/06/2022, 11:33 AM

## 2022-06-06 NOTE — Evaluation (Signed)
Physical Therapy Evaluation and Discharge Patient Details Name: Jill Curtis MRN: [id number] DOB: [birth year] Today's Date: 06/06/2022  History of Present Illness  Pt is a 89 y.o. F who presents 06/05/2022 with new onset of binocular blurred vision, dizziness and left hand numbness with weakness. Symptoms lasted 1-2 hours then spontaneously resolved. Negative CTA of head and neck and MRI brain. Significant PMH: HLD,HTN, thyroid disease.  Clinical Impression  Patient evaluated by Physical Therapy with no further acute PT needs identified. Pt reports symptoms are resolved and is at her baseline. Reports mild fuzzy vision bilaterally, however, all visual fields were intact and functional. Pt ambulating hallway distances with no assistive device and negotiated 2 steps without physical difficulty or assist. All education has been completed and the patient has no further questions. No follow-up Physical Therapy or equipment needs. PT is signing off. Thank you for this referral.      Recommendations for follow up therapy are one component of a multi-disciplinary discharge planning process, led by the attending physician.  Recommendations may be updated based on patient status, additional functional criteria and insurance authorization.  Follow Up Recommendations       Assistance Recommended at Discharge PRN  Patient can return home with the following       Equipment Recommendations None recommended by PT  Recommendations for Other Services       Functional Status Assessment Patient has not had a recent decline in their functional status     Precautions / Restrictions Precautions Precautions: None Restrictions Weight Bearing Restrictions: No      Mobility  Bed Mobility Overal bed mobility: Modified Independent                  Transfers Overall transfer level: Independent Equipment used: None                    Ambulation/Gait Ambulation/Gait assistance: Modified  independent (Device/Increase time) Gait Distance (Feet): 400 Feet Assistive device: None Gait Pattern/deviations: Step-through pattern       General Gait Details: Mildly uncoordinated gait pattern, which pt reports is baseline. Able to step around/over obstacles and perform head turns without LOB  Stairs Stairs: Yes Stairs assistance: Supervision Stair Management: No rails Number of Stairs: 2 General stair comments: step by step descending  Wheelchair Mobility    Modified Rankin (Stroke Patients Only) Modified Rankin (Stroke Patients Only) Pre-Morbid Rankin Score: No significant disability Modified Rankin: No significant disability     Balance Overall balance assessment: Mild deficits observed, not formally tested                                           Pertinent Vitals/Pain Pain Assessment Pain Assessment: No/denies pain    Home Living Family/patient expects to be discharged to:: Private residence Living Arrangements: Alone Available Help at Discharge: Family Type of Home: House Home Access: Level entry       Home Layout: One level Home Equipment: None      Prior Function Prior Level of Function : Independent/Modified Independent;Driving             Mobility Comments: Walks 1-5 miles every day       Hand Dominance        Extremity/Trunk Assessment   Upper Extremity Assessment Upper Extremity Assessment: Overall WFL for tasks assessed    Lower Extremity Assessment Lower Extremity Assessment: Overall  WFL for tasks assessed    Cervical / Trunk Assessment Cervical / Trunk Assessment: Kyphotic  Communication   Communication: No difficulties  Cognition Arousal/Alertness: Awake/alert Behavior During Therapy: WFL for tasks assessed/performed Overall Cognitive Status: Within Functional Limits for tasks assessed                                          General Comments      Exercises      Assessment/Plan    PT Assessment Patient does not need any further PT services  PT Problem List         PT Treatment Interventions      PT Goals (Current goals can be found in the Care Plan section)  Acute Rehab PT Goals Patient Stated Goal: go home PT Goal Formulation: All assessment and education complete, DC therapy    Frequency       Co-evaluation               AM-PAC PT "6 Clicks" Mobility  Outcome Measure Help needed turning from your back to your side while in a flat bed without using bedrails?: None Help needed moving from lying on your back to sitting on the side of a flat bed without using bedrails?: None Help needed moving to and from a bed to a chair (including a wheelchair)?: None Help needed standing up from a chair using your arms (e.g., wheelchair or bedside chair)?: None Help needed to walk in hospital room?: None Help needed climbing 3-5 steps with a railing? : A Little 6 Click Score: 23    End of Session Equipment Utilized During Treatment: Gait belt Activity Tolerance: Patient tolerated treatment well Patient left: in bed;with call bell/phone within reach;with nursing/sitter in room;with family/visitor present Nurse Communication: Mobility status PT Visit Diagnosis: Unsteadiness on feet (R26.81)    Time: 1610-9604 PT Time Calculation (min) (ACUTE ONLY): 39 min   Charges:   PT Evaluation $PT Eval Low Complexity: 1 Low PT Treatments $Therapeutic Activity: 23-37 mins        Jill Curtis, PT, DPT Acute Rehabilitation Services Office [phone]   Norval Morton 06/06/2022, 12:41 PM

## 2022-06-06 NOTE — Progress Notes (Signed)
  Echocardiogram 2D Echocardiogram has been performed.  Jill Curtis 06/06/2022, 10:12 AM

## 2022-06-06 NOTE — Progress Notes (Signed)
SLP Cancellation Note  Patient Details Name: Jill Curtis MRN: [id number] DOB: 1929-05-07   Cancelled treatment:        Pt seen for cog screening which determined the pt is not appropriate for full SLE due to pt being at baseline. Pt is oriented x4 and both the pt and pt's family report she is back at her baseline prior to admission. SLP signing off.    Dione Housekeeper M.S. CF-SLP

## 2022-06-06 NOTE — Discharge Summary (Incomplete)
Physician Discharge Summary   Patient: Jill Curtis MRN: [id number] DOB: 17-Sep-1929  Admit date:     06/05/2022  Discharge date: 06/06/2022  Discharge Physician: Marguerita Merles, DO   PCP: Eartha Inch, MD   Recommendations at discharge:   Follow-up with PCP within 1 to 2 weeks repeat CBC, CMP, mag, Phos within 1 week Follow-up with neurology in outpatient setting in 3 to 4 weeks and per neurology she is advised not to drive until cleared by outpatient neurology  Discharge Diagnoses: Principal Problem:   TIA (transient ischemic attack) Active Problems:   Hypertension   Thyroid disease   Hyperlipidemia  Resolved Problems:   * No resolved hospital problems. [facility] Course: HPI per Dr. Marcial Pacas Opyd on 06/05/22 HPI: Jill Curtis is a pleasant 89 y.o. female with medical history significant for hypertension, hyperlipidemia, and hypothyroidism who presents to the emergency department after an episode of blurred vision and left hand numbness and weakness.   Patient reports that she was feeling more fatigued than usual yesterday and then had an episode of blurred vision involving both eyes.  She also noted that her left arm "felt dead" from the elbow to the hand.  Blurred vision and left arm symptoms resolved after 30 minutes to an hour.  She now feels anxious and wonders if her heart is skipping beats.  She denies chest pain or lightheadedness.   [facility] ED Course: Upon arrival to the ED, patient is found to be afebrile and saturating well on room air with normal heart rate and stable blood pressure.  MRI brain is negative for acute intracranial abnormality.  Labs are notable for slightly low sodium and slightly elevated TSH.   **Interim History She underwent full workup and completed her TIA workup and MRI was negative for acute CVA.  Neurology recommended dual antiplatelet therapy for 3 weeks and then just aspirin monotherapy.  After discussion with neurology they  recommend that the patient cannot drive until outpatient evaluation by neurology in 3 to 4 weeks.  PT OT recommending no follow-up  Assessment and Plan: No notes have been filed under this hospital service. Service: Hospitalist  TIA  - Presents after an episode of blurred vision and distal LUE numbness and weakness   - No acute findings on MRI or CTA head & neck   - Continue cardiac monitoring and neuro checks, check echocardiogram, lipids, and A1c, consult PT/OT/SLP, continue ASA and statin, follow-up on any additional neurology recommendations   -Lipid Panel done and showed total cholesterol/HDL ratio 2.2, cholesterol level 166, LDL of 84, HDL 74, triglycerides 42 and VLDL of 8 -Neurology recommending dual antiplatelet therapy for 3 weeks and then just aspirin monotherapy -Neurology also recommended the patient not drive until evaluated by outpatient neurology given that some of her TIA symptoms started while she was driving   Hypertension  -Continue lisinopril   -Continue monitor blood pressures per protocol follow-up in outpatient setting   Hypothyroidism  - TSH was checked in ED and slightly elevated  - Check free T4, continue current dose of Synthroid for now   -Free T4 was normal at 0.79 and TSH was 4.832  Hyperglycemia -Likely reactive, hemoglobin A1c 5.6  HLD -Lipid Panel done and showed total cholesterol/HDL ratio 2.2, cholesterol level 166, LDL of 84, HDL 74, triglycerides 42 and VLDL of 8 -Goal LDL is <70 -Neurology recommending increasing to Rosuvastatin 10 mg po Daily  Hyponatremia -Mild and Improved -Na+ Trend: Recent Labs  Lab 06/05/22 1711  06/06/22 0425  NA 133* 138  -Continue to Monitor and trend and repeat CMP in outpatient setting   Consultants: Neurology Procedures performed: As delineated as above  Disposition: Home Diet recommendation:  Discharge Diet Orders (From admission, onward)     Start     Ordered   06/06/22 0000  Diet - low sodium heart  healthy        06/06/22 1558           Cardiac diet DISCHARGE MEDICATION: Allergies as of 06/06/2022   No Known Allergies      Medication List     TAKE these medications    aspirin EC 81 MG tablet Take 1 tablet (81 mg total) by mouth daily. Swallow whole.   cetirizine 10 MG tablet Commonly known as: ZYRTEC Take 10 mg by mouth daily.   cholecalciferol 1000 units tablet Commonly known as: VITAMIN D Take 1,000 Units by mouth daily.   clopidogrel 75 MG tablet Commonly known as: PLAVIX Take 1 tablet (75 mg total) by mouth daily.   ezetimibe 10 MG tablet Commonly known as: ZETIA Take 10 mg by mouth at bedtime.   levothyroxine 88 MCG tablet Commonly known as: SYNTHROID Take 88 mcg by mouth daily.   lisinopril 5 MG tablet Commonly known as: ZESTRIL Take by mouth.   Magnesium 250 MG Tabs Take 1 tablet by mouth daily.   nebivolol 5 MG tablet Commonly known as: BYSTOLIC Take 5 mg by mouth daily.   rosuvastatin 10 MG tablet Commonly known as: CRESTOR Take 1 tablet (10 mg total) by mouth daily. Start taking on: Jun 07, 2022 What changed:  medication strength how much to take when to take this   tamsulosin 0.4 MG Caps capsule Commonly known as: Flomax Take 1 capsule (0.4 mg total) by mouth daily after supper.        Follow-up Information     Eartha Inch, MD. Call.   Specialty: Family Medicine Why: Follow up within 1-2 weeks Contact information: [address] [place] [address] [phone]         [facility]. Call.   Specialty: Neurology Why: Follow up in 3-4 weeks. If [facility] does not call for a follow appointment please call for Scheduling Contact information: [address] [place] [address] [phone]               Discharge Exam: Ceasar Mons Weights   06/05/22 1701  Weight: 52.2 kg   Vitals:   06/06/22 1104 06/06/22 1442  BP: (!) 159/79 124/71   Pulse: 71 71  Resp: 20 18  Temp: 98.5 F (36.9 C)   SpO2: 97% 97%   Examination: Physical Exam:  Constitutional: Thin elderly Caucasian female in no acute distress Respiratory: Diminished to auscultation bilaterally with some coarse breath sounds, no wheezing, rales, rhonchi or crackles. Normal respiratory effort and patient is not tachypenic. No accessory muscle use.  Unlabored breathing Cardiovascular: RRR, no murmurs / rubs / gallops. S1 and S2 auscultated. No extremity edema. Abdomen: Soft, non-tender, non-distended. Bowel sounds positive.  GU: Deferred. Musculoskeletal: No clubbing / cyanosis of digits/nails. No joint deformity upper and lower extremities. .  Skin: No rashes, lesions, ulcers on limited skin evaluation. No induration; Warm and dry.  Neurologic: CN 2-12 grossly intact with no focal deficits. Romberg sign and cerebellar reflexes not assessed.  Psychiatric: Normal judgment and insight. Alert and oriented x 3. Normal mood and appropriate affect.   Condition  at discharge: stable  The results of significant diagnostics from this hospitalization (including imaging, microbiology, ancillary and laboratory) are listed below for reference.   Imaging Studies: ECHOCARDIOGRAM COMPLETE  Result Date: 06/06/2022    ECHOCARDIOGRAM REPORT   Patient Name:   SANNIE SEVICK Date of Exam: 06/06/2022 Medical Rec #:  [id number]     Height:       60.0 in Accession #:    [id number]    Weight:       115.0 lb Date of Birth:  11-17-1929    BSA:          1.475 m Patient Age:    89 years      BP:           143/81 mmHg Patient Gender: F             HR:           71 bpm. Exam Location:  Inpatient Procedure: 2D Echo, Color Doppler and Cardiac Doppler Indications:    TIA  History:        Patient has no prior history of Echocardiogram examinations.                 Risk Factors:Dyslipidemia and Hypertension.  Sonographer:    Delcie Roch RDCS Referring Phys: [id number] TIMOTHY S OPYD IMPRESSIONS  1. Left  ventricular ejection fraction, by estimation, is 60 to 65%. The left ventricle has normal function. The left ventricle has no regional wall motion abnormalities. Left ventricular diastolic parameters are consistent with Grade I diastolic dysfunction (impaired relaxation).  2. Right ventricular systolic function is normal. The right ventricular size is normal. There is normal pulmonary artery systolic pressure. The estimated right ventricular systolic pressure is 18.1 mmHg.  3. The mitral valve is grossly normal. Trivial mitral valve regurgitation. No evidence of mitral stenosis.  4. The aortic valve is tricuspid. Aortic valve regurgitation is not visualized. No aortic stenosis is present.  5. The inferior vena cava is normal in size with greater than 50% respiratory variability, suggesting right atrial pressure of 3 mmHg. FINDINGS  Left Ventricle: Left ventricular ejection fraction, by estimation, is 60 to 65%. The left ventricle has normal function. The left ventricle has no regional wall motion abnormalities. The left ventricular internal cavity size was normal in size. There is  no left ventricular hypertrophy. Left ventricular diastolic parameters are consistent with Grade I diastolic dysfunction (impaired relaxation). Right Ventricle: The right ventricular size is normal. No increase in right ventricular wall thickness. Right ventricular systolic function is normal. There is normal pulmonary artery systolic pressure. The tricuspid regurgitant velocity is 1.94 m/s, and  with an assumed right atrial pressure of 3 mmHg, the estimated right ventricular systolic pressure is 18.1 mmHg. Left Atrium: Left atrial size was normal in size. Right Atrium: Right atrial size was normal in size. Pericardium: There is no evidence of pericardial effusion. Mitral Valve: The mitral valve is grossly normal. Trivial mitral valve regurgitation. No evidence of mitral valve stenosis. Tricuspid Valve: The tricuspid valve is grossly  normal. Tricuspid valve regurgitation is mild . No evidence of tricuspid stenosis. Aortic Valve: The aortic valve is tricuspid. Aortic valve regurgitation is not visualized. No aortic stenosis is present. Pulmonic Valve: The pulmonic valve was grossly normal. Pulmonic valve regurgitation is mild. No evidence of pulmonic stenosis. Aorta: The aortic root and ascending aorta are structurally normal, with no evidence of dilitation. Venous: The inferior vena cava is normal in size with greater than 50% respiratory  variability, suggesting right atrial pressure of 3 mmHg. IAS/Shunts: The atrial septum is grossly normal.  LEFT VENTRICLE PLAX 2D LVIDd:         3.30 cm   Diastology LVIDs:         2.00 cm   LV e' medial:    5.98 cm/s LV PW:         1.10 cm   LV E/e' medial:  10.7 LV IVS:        0.90 cm   LV e' lateral:   7.94 cm/s LVOT diam:     1.70 cm   LV E/e' lateral: 8.1 LV SV:         49 LV SV Index:   33 LVOT Area:     2.27 cm  RIGHT VENTRICLE             IVC RV Basal diam:  2.30 cm     IVC diam: 1.00 cm RV S prime:     11.30 cm/s LEFT ATRIUM             Index        RIGHT ATRIUM           Index LA diam:        2.80 cm 1.90 cm/m   RA Area:     10.40 cm LA Vol (A2C):   32.0 ml 21.69 ml/m  RA Volume:   20.90 ml  14.17 ml/m LA Vol (A4C):   26.9 ml 18.23 ml/m LA Biplane Vol: 30.1 ml 20.40 ml/m  AORTIC VALVE             PULMONIC VALVE LVOT Vmax:   93.00 cm/s  PR End Diast Vel: 4.93 msec LVOT Vmean:  62.000 cm/s LVOT VTI:    0.215 m  AORTA Ao Root diam: 2.80 cm Ao Asc diam:  2.80 cm MITRAL VALVE               TRICUSPID VALVE MV Area (PHT): 3.31 cm    TR Peak grad:   15.1 mmHg MV Decel Time: 229 msec    TR Vmax:        194.00 cm/s MV E velocity: 64.00 cm/s MV A velocity: 73.90 cm/s  SHUNTS MV E/A ratio:  0.87        Systemic VTI:  0.22 m                            Systemic Diam: 1.70 cm Lennie Odor MD Electronically signed by Lennie Odor MD Signature Date/Time: 06/06/2022/12:09:17 PM    Final    CT ANGIO HEAD NECK  W WO CM  Result Date: 06/05/2022 CLINICAL DATA:  Initial evaluation for transient visual disturbance. EXAM: CT ANGIOGRAPHY HEAD AND NECK WITH AND WITHOUT CONTRAST TECHNIQUE: Multidetector CT imaging of the head and neck was performed using the standard protocol during bolus administration of intravenous contrast. Multiplanar CT image reconstructions and MIPs were obtained to evaluate the vascular anatomy. Carotid stenosis measurements (when applicable) are obtained utilizing NASCET criteria, using the distal internal carotid diameter as the denominator. RADIATION DOSE REDUCTION: This exam was performed according to the departmental dose-optimization program which includes automated exposure control, adjustment of the mA and/or kV according to patient size and/or use of iterative reconstruction technique. CONTRAST:  75mL OMNIPAQUE IOHEXOL 350 MG/ML SOLN COMPARISON:  MRI from the same day. FINDINGS: CT HEAD FINDINGS Brain: Cerebral and within normal limits. Mild chronic microvascular ischemic disease.  No acute intracranial hemorrhage. No acute large vessel territory infarct. No mass lesion or midline shift. No hydrocephalus or extra-axial fluid collection. Vascular: No abnormal hyperdense vessel. Skull: Scalp soft tissues and calvarium demonstrate no acute finding. Sinuses/Orbits: Globes and orbital soft tissues within normal limits. Paranasal sinuses and mastoid air cells are clear. Other: None. Review of the MIP images confirms the above findings CTA NECK FINDINGS Aortic arch: Normal caliber with standard branch pattern. Mild atheromatous change about the arch itself. No stenosis about the origin the great vessels. Right carotid system: Right common and internal carotid arteries are patent without stenosis or dissection. Left carotid system: Left common and internal carotid arteries are patent without stenosis or dissection. Vertebral arteries: Both vertebral arteries arise from subclavian arteries. No proximal  subclavian artery stenosis. Left vertebral artery slightly dominant. Vertebral arteries are patent without significant stenosis or dissection. Skeleton: No discrete or worrisome osseous lesions. Advanced spondylosis present at C4-5 through C6-7. Degenerative changes about the TMJs. Other neck: No other acute finding within the neck. Upper chest: No other acute finding. Review of the MIP images confirms the above findings CTA HEAD FINDINGS Anterior circulation: Both internal carotid arteries widely patent to the termini without stenosis. A1 segments widely patent. Normal anterior communicating artery complex. Both anterior cerebral arteries widely patent to their distal aspects without stenosis. No M1 stenosis or occlusion. Normal MCA bifurcations. Distal MCA branches well perfused and symmetric. Posterior circulation: Both vertebral arteries patent without stenosis. Neither PICA well visualized. Basilar patent without stenosis. Superior cerebellar arteries patent bilaterally. Both PCAs primarily supplied via the basilar. Mild atheromatous irregularity about the PCAs with associated short-segment mild distal left P2 stenosis (series 16, image 28). PCAs otherwise patent without significant stenosis. Venous sinuses: Patent allowing for timing the contrast bolus. Anatomic variants: None significant.  No aneurysm. Review of the MIP images confirms the above findings IMPRESSION: 1. Negative CTA of the head and neck. No large vessel occlusion or other emergent finding. No hemodynamically significant or correctable stenosis. 2. No other acute intracranial abnormality. 3. Mild chronic microvascular ischemic disease. Aortic Atherosclerosis (ICD10-I70.0). Electronically Signed   By: Rise Mu M.D.   On: 06/05/2022 19:21   MR BRAIN WO CONTRAST  Result Date: 06/05/2022 CLINICAL DATA:  Initial evaluation for change in visual disturbance. EXAM: MRI HEAD WITHOUT CONTRAST TECHNIQUE: Multiplanar, multiecho pulse  sequences of the brain and surrounding structures were obtained without intravenous contrast. COMPARISON:  None Available. FINDINGS: Brain: Cerebral volume within normal limits. Mild chronic microvascular ischemic disease for age. No acute or subacute ischemia. No areas of chronic cortical infarction. No acute intracranial hemorrhage. Multiple scattered chronic micro hemorrhages noted involving the right greater than left cerebral hemispheres, nonspecific. No mass lesion, midline shift or mass effect. No hydrocephalus or extra-axial fluid collection. Pituitary gland and suprasellar region within normal limits. Vascular: Major intracranial vascular flow voids are maintained. Skull and upper cervical spine: Craniocervical junction normal. Advanced spondylosis noted at C4-5 without high-grade spinal stenosis. Bone marrow signal intensity normal. No scalp soft tissue abnormality. Sinuses/Orbits: Prior bilateral ocular lens replacement. Paranasal sinuses and mastoid air cells are largely clear. Other: None. IMPRESSION: 1. No acute intracranial abnormality. 2. Mild chronic microvascular ischemic disease for age. 3. There is multiple scattered chronic micro hemorrhages involving the right greater than left cerebral hemispheres, nonspecific, with differential considerations including changes of chronic hypertension or cerebral amyloid angiopathy. Electronically Signed   By: Rise Mu M.D.   On: 06/05/2022 19:09   DG Chest Portable  1 View  Result Date: 06/05/2022 CLINICAL DATA:  Left-sided weakness EXAM: PORTABLE CHEST 1 VIEW COMPARISON:  None Available. FINDINGS: No acute airspace disease. Normal cardiomediastinal silhouette. Mild diffuse coarse interstitial opacity likely due to chronic change. Aortic atherosclerosis. No pneumothorax. IMPRESSION: No active disease. Electronically Signed   By: Jasmine Pang M.D.   On: 06/05/2022 17:55    Microbiology: Results for orders placed or performed in visit on  11/07/18  Novel Coronavirus, NAA ([facility])     Status: Abnormal   Collection Time: 11/07/18 12:00 AM   Specimen: Nasopharyngeal(NP) swabs in vial transport medium   NASOPHARYNGE  TESTING  Result Value Ref Range Status   SARS-CoV-2, NAA Detected (A) Not Detected Final    Comment: This nucleic acid amplification test was developed and its performance characteristics determined by [employer]. Nucleic acid amplification tests include PCR and TMA. This test has not been FDA cleared or approved. This test has been authorized by FDA under an Emergency Use Authorization (EUA). This test is only authorized for the duration of time the declaration that circumstances exist justifying the authorization of the emergency use of in vitro diagnostic tests for detection of SARS-CoV-2 virus and/or diagnosis of COVID-19 infection under section 564(b)(1) of the Act, 21 U.S.C. 161WRU-0(A) (1), unless the authorization is terminated or revoked sooner. When diagnostic testing is negative, the possibility of a false negative result should be considered in the context of a patient's recent exposures and the presence of clinical signs and symptoms consistent with COVID-19. An individual without symptoms of COVID-19 and who is not shedding SARS-CoV-2 virus would  expect to have a negative (not detected) result in this assay.    Labs: CBC: Recent Labs  Lab 06/05/22 1711 06/06/22 0425  WBC 7.0 7.4  NEUTROABS 4.3  --   HGB 12.1 12.5  HCT 35.4* 35.5*  MCV 94.9 91.7  PLT 155 161   Basic Metabolic Panel: Recent Labs  Lab 06/05/22 1711 06/06/22 0425  NA 133* 138  K 3.7 3.6  CL 100 105  CO2 25 24  GLUCOSE 112* 106*  BUN 16 10  CREATININE 0.73 0.70  CALCIUM 9.0 8.9   Liver Function Tests: Recent Labs  Lab 06/05/22 1711  AST 18  ALT 15  ALKPHOS 36*  BILITOT 0.4  PROT 6.0*  ALBUMIN 4.1   CBG: No results for input(s): "GLUCAP" in the last 168 hours.  Discharge time spent:  greater than 30 minutes.  Signed: Merlene Laughter, DO [facility] 06/06/2022

## 2022-06-06 NOTE — Progress Notes (Addendum)
STROKE TEAM PROGRESS NOTE   INTERVAL HISTORY Her family is at the bedside.  Patient states that she had 2 separate instances yesterday.  One was she was driving and her left arm went "dead "which lasted about 30 minutes and resolved.  And then she had another episode of blurry vision that lasted about 30 minutes as well and both of these episodes were in about a 2-hour timeframe.  Symptoms have resolved and have not returned MRI brain negative for acute abnormality.  Vitals:   06/06/22 0725 06/06/22 1103 06/06/22 1104 06/06/22 1442  BP: (!) 143/81 (!) 159/79 (!) 159/79 124/71  Pulse: 83  71 71  Resp: 18  20 18   Temp: 98.7 F (37.1 C)  98.5 F (36.9 C)   TempSrc: Oral  Oral   SpO2: 96%  97% 97%  Weight:      Height:       CBC:  Recent Labs  Lab 06/05/22 1711 06/06/22 0425  WBC 7.0 7.4  NEUTROABS 4.3  --   HGB 12.1 12.5  HCT 35.4* 35.5*  MCV 94.9 91.7  PLT 155 161   Basic Metabolic Panel:  Recent Labs  Lab 06/05/22 1711 06/06/22 0425  NA 133* 138  K 3.7 3.6  CL 100 105  CO2 25 24  GLUCOSE 112* 106*  BUN 16 10  CREATININE 0.73 0.70  CALCIUM 9.0 8.9   Lipid Panel:  Recent Labs  Lab 06/06/22 0425  CHOL 166  TRIG 42  HDL 74  CHOLHDL 2.2  VLDL 8  LDLCALC 84   HgbA1c:  Recent Labs  Lab 06/06/22 0425  HGBA1C 5.6   Urine Drug Screen: No results for input(s): "LABOPIA", "COCAINSCRNUR", "LABBENZ", "AMPHETMU", "THCU", "LABBARB" in the last 168 hours.  Alcohol Level No results for input(s): "ETH" in the last 168 hours.  IMAGING past 24 hours ECHOCARDIOGRAM COMPLETE  Result Date: 06/06/2022    ECHOCARDIOGRAM REPORT   Patient Name:   Jill Curtis Date of Exam: 06/06/2022 Medical Rec #:  [id number]     Height:       60.0 in Accession #:    [id number]    Weight:       115.0 lb Date of Birth:  1929-02-11    BSA:          1.475 m Patient Age:    89 years      BP:           143/81 mmHg Patient Gender: F             HR:           71 bpm. Exam Location:  Inpatient  Procedure: 2D Echo, Color Doppler and Cardiac Doppler Indications:    TIA  History:        Patient has no prior history of Echocardiogram examinations.                 Risk Factors:Dyslipidemia and Hypertension.  Sonographer:    Delcie Roch RDCS Referring Phys: [id number] TIMOTHY S OPYD IMPRESSIONS  1. Left ventricular ejection fraction, by estimation, is 60 to 65%. The left ventricle has normal function. The left ventricle has no regional wall motion abnormalities. Left ventricular diastolic parameters are consistent with Grade I diastolic dysfunction (impaired relaxation).  2. Right ventricular systolic function is normal. The right ventricular size is normal. There is normal pulmonary artery systolic pressure. The estimated right ventricular systolic pressure is 18.1 mmHg.  3. The mitral valve is grossly normal. Trivial mitral  valve regurgitation. No evidence of mitral stenosis.  4. The aortic valve is tricuspid. Aortic valve regurgitation is not visualized. No aortic stenosis is present.  5. The inferior vena cava is normal in size with greater than 50% respiratory variability, suggesting right atrial pressure of 3 mmHg. FINDINGS  Left Ventricle: Left ventricular ejection fraction, by estimation, is 60 to 65%. The left ventricle has normal function. The left ventricle has no regional wall motion abnormalities. The left ventricular internal cavity size was normal in size. There is  no left ventricular hypertrophy. Left ventricular diastolic parameters are consistent with Grade I diastolic dysfunction (impaired relaxation). Right Ventricle: The right ventricular size is normal. No increase in right ventricular wall thickness. Right ventricular systolic function is normal. There is normal pulmonary artery systolic pressure. The tricuspid regurgitant velocity is 1.94 m/s, and  with an assumed right atrial pressure of 3 mmHg, the estimated right ventricular systolic pressure is 18.1 mmHg. Left Atrium: Left atrial  size was normal in size. Right Atrium: Right atrial size was normal in size. Pericardium: There is no evidence of pericardial effusion. Mitral Valve: The mitral valve is grossly normal. Trivial mitral valve regurgitation. No evidence of mitral valve stenosis. Tricuspid Valve: The tricuspid valve is grossly normal. Tricuspid valve regurgitation is mild . No evidence of tricuspid stenosis. Aortic Valve: The aortic valve is tricuspid. Aortic valve regurgitation is not visualized. No aortic stenosis is present. Pulmonic Valve: The pulmonic valve was grossly normal. Pulmonic valve regurgitation is mild. No evidence of pulmonic stenosis. Aorta: The aortic root and ascending aorta are structurally normal, with no evidence of dilitation. Venous: The inferior vena cava is normal in size with greater than 50% respiratory variability, suggesting right atrial pressure of 3 mmHg. IAS/Shunts: The atrial septum is grossly normal.  LEFT VENTRICLE PLAX 2D LVIDd:         3.30 cm   Diastology LVIDs:         2.00 cm   LV e' medial:    5.98 cm/s LV PW:         1.10 cm   LV E/e' medial:  10.7 LV IVS:        0.90 cm   LV e' lateral:   7.94 cm/s LVOT diam:     1.70 cm   LV E/e' lateral: 8.1 LV SV:         49 LV SV Index:   33 LVOT Area:     2.27 cm  RIGHT VENTRICLE             IVC RV Basal diam:  2.30 cm     IVC diam: 1.00 cm RV S prime:     11.30 cm/s LEFT ATRIUM             Index        RIGHT ATRIUM           Index LA diam:        2.80 cm 1.90 cm/m   RA Area:     10.40 cm LA Vol (A2C):   32.0 ml 21.69 ml/m  RA Volume:   20.90 ml  14.17 ml/m LA Vol (A4C):   26.9 ml 18.23 ml/m LA Biplane Vol: 30.1 ml 20.40 ml/m  AORTIC VALVE             PULMONIC VALVE LVOT Vmax:   93.00 cm/s  PR End Diast Vel: 4.93 msec LVOT Vmean:  62.000 cm/s LVOT VTI:    0.215 m  AORTA  Ao Root diam: 2.80 cm Ao Asc diam:  2.80 cm MITRAL VALVE               TRICUSPID VALVE MV Area (PHT): 3.31 cm    TR Peak grad:   15.1 mmHg MV Decel Time: 229 msec    TR Vmax:         194.00 cm/s MV E velocity: 64.00 cm/s MV A velocity: 73.90 cm/s  SHUNTS MV E/A ratio:  0.87        Systemic VTI:  0.22 m                            Systemic Diam: 1.70 cm Lennie Odor MD Electronically signed by Lennie Odor MD Signature Date/Time: 06/06/2022/12:09:17 PM    Final    CT ANGIO HEAD NECK W WO CM  Result Date: 06/05/2022 CLINICAL DATA:  Initial evaluation for transient visual disturbance. EXAM: CT ANGIOGRAPHY HEAD AND NECK WITH AND WITHOUT CONTRAST TECHNIQUE: Multidetector CT imaging of the head and neck was performed using the standard protocol during bolus administration of intravenous contrast. Multiplanar CT image reconstructions and MIPs were obtained to evaluate the vascular anatomy. Carotid stenosis measurements (when applicable) are obtained utilizing NASCET criteria, using the distal internal carotid diameter as the denominator. RADIATION DOSE REDUCTION: This exam was performed according to the departmental dose-optimization program which includes automated exposure control, adjustment of the mA and/or kV according to patient size and/or use of iterative reconstruction technique. CONTRAST:  75mL OMNIPAQUE IOHEXOL 350 MG/ML SOLN COMPARISON:  MRI from the same day. FINDINGS: CT HEAD FINDINGS Brain: Cerebral and within normal limits. Mild chronic microvascular ischemic disease. No acute intracranial hemorrhage. No acute large vessel territory infarct. No mass lesion or midline shift. No hydrocephalus or extra-axial fluid collection. Vascular: No abnormal hyperdense vessel. Skull: Scalp soft tissues and calvarium demonstrate no acute finding. Sinuses/Orbits: Globes and orbital soft tissues within normal limits. Paranasal sinuses and mastoid air cells are clear. Other: None. Review of the MIP images confirms the above findings CTA NECK FINDINGS Aortic arch: Normal caliber with standard branch pattern. Mild atheromatous change about the arch itself. No stenosis about the origin the great  vessels. Right carotid system: Right common and internal carotid arteries are patent without stenosis or dissection. Left carotid system: Left common and internal carotid arteries are patent without stenosis or dissection. Vertebral arteries: Both vertebral arteries arise from subclavian arteries. No proximal subclavian artery stenosis. Left vertebral artery slightly dominant. Vertebral arteries are patent without significant stenosis or dissection. Skeleton: No discrete or worrisome osseous lesions. Advanced spondylosis present at C4-5 through C6-7. Degenerative changes about the TMJs. Other neck: No other acute finding within the neck. Upper chest: No other acute finding. Review of the MIP images confirms the above findings CTA HEAD FINDINGS Anterior circulation: Both internal carotid arteries widely patent to the termini without stenosis. A1 segments widely patent. Normal anterior communicating artery complex. Both anterior cerebral arteries widely patent to their distal aspects without stenosis. No M1 stenosis or occlusion. Normal MCA bifurcations. Distal MCA branches well perfused and symmetric. Posterior circulation: Both vertebral arteries patent without stenosis. Neither PICA well visualized. Basilar patent without stenosis. Superior cerebellar arteries patent bilaterally. Both PCAs primarily supplied via the basilar. Mild atheromatous irregularity about the PCAs with associated short-segment mild distal left P2 stenosis (series 16, image 28). PCAs otherwise patent without significant stenosis. Venous sinuses: Patent allowing for timing the contrast bolus. Anatomic variants: None  significant.  No aneurysm. Review of the MIP images confirms the above findings IMPRESSION: 1. Negative CTA of the head and neck. No large vessel occlusion or other emergent finding. No hemodynamically significant or correctable stenosis. 2. No other acute intracranial abnormality. 3. Mild chronic microvascular ischemic disease.  Aortic Atherosclerosis (ICD10-I70.0). Electronically Signed   By: Rise Mu M.D.   On: 06/05/2022 19:21   MR BRAIN WO CONTRAST  Result Date: 06/05/2022 CLINICAL DATA:  Initial evaluation for change in visual disturbance. EXAM: MRI HEAD WITHOUT CONTRAST TECHNIQUE: Multiplanar, multiecho pulse sequences of the brain and surrounding structures were obtained without intravenous contrast. COMPARISON:  None Available. FINDINGS: Brain: Cerebral volume within normal limits. Mild chronic microvascular ischemic disease for age. No acute or subacute ischemia. No areas of chronic cortical infarction. No acute intracranial hemorrhage. Multiple scattered chronic micro hemorrhages noted involving the right greater than left cerebral hemispheres, nonspecific. No mass lesion, midline shift or mass effect. No hydrocephalus or extra-axial fluid collection. Pituitary gland and suprasellar region within normal limits. Vascular: Major intracranial vascular flow voids are maintained. Skull and upper cervical spine: Craniocervical junction normal. Advanced spondylosis noted at C4-5 without high-grade spinal stenosis. Bone marrow signal intensity normal. No scalp soft tissue abnormality. Sinuses/Orbits: Prior bilateral ocular lens replacement. Paranasal sinuses and mastoid air cells are largely clear. Other: None. IMPRESSION: 1. No acute intracranial abnormality. 2. Mild chronic microvascular ischemic disease for age. 3. There is multiple scattered chronic micro hemorrhages involving the right greater than left cerebral hemispheres, nonspecific, with differential considerations including changes of chronic hypertension or cerebral amyloid angiopathy. Electronically Signed   By: Rise Mu M.D.   On: 06/05/2022 19:09   DG Chest Portable 1 View  Result Date: 06/05/2022 CLINICAL DATA:  Left-sided weakness EXAM: PORTABLE CHEST 1 VIEW COMPARISON:  None Available. FINDINGS: No acute airspace disease. Normal  cardiomediastinal silhouette. Mild diffuse coarse interstitial opacity likely due to chronic change. Aortic atherosclerosis. No pneumothorax. IMPRESSION: No active disease. Electronically Signed   By: Jasmine Pang M.D.   On: 06/05/2022 17:55    PHYSICAL EXAM  Temp:  [97.7 F (36.5 C)-98.7 F (37.1 C)] 98.5 F (36.9 C) (05/18 1104) Pulse Rate:  [67-83] 71 (05/18 1442) Resp:  [12-20] 18 (05/18 1442) BP: (124-175)/(66-97) 124/71 (05/18 1442) SpO2:  [96 %-100 %] 97 % (05/18 1442) Weight:  [52.2 kg] 52.2 kg (05/17 1701)  General - Well nourished, well developed, in no apparent distress. Cardiovascular - Regular rhythm and rate.  Mental Status -  Level of arousal and orientation to time, place, and person were intact. Language including expression, naming, repetition, comprehension was assessed and found intact. Attention span and concentration were normal. Recent and remote memory were intact. Fund of Knowledge was assessed and was intact.  Cranial Nerves II - XII - II - Visual field intact OU. III, IV, VI - Extraocular movements intact. V - Facial sensation intact bilaterally. VII - Facial movement intact bilaterally. VIII - Hearing & vestibular intact bilaterally. X - Palate elevates symmetrically. XI - Chin turning & shoulder shrug intact bilaterally. XII - Tongue protrusion intact.  Motor Strength - The patient's strength was normal in all extremities and pronator drift was absent.  Bulk was normal and fasciculations were absent.   Motor Tone - Muscle tone was assessed at the neck and appendages and was normal.  Sensory - Light touch, temperature/pinprick were assessed and were symmetrical.    Coordination - The patient had normal movements in the hands and feet with  no ataxia or dysmetria.  Tremor was absent.  Gait and Station - deferred.  ASSESSMENT/PLAN [name] is a 89 y.o. female with history of hypertension, hyperlipidemia, and hypothyroidism who presents to  the emergency department after 2 separate episodes of blurred vision and left hand numbness and weakness each lasting 30 minutes  TIA CT head No acute abnormality.   CTA head & neck negative for LVO MRI no acute process.  Chronic microvascular ischemia.  Multiple scattered chronic microhemorrhages 2D Echo EF 60 to 65%.  Grade 1 diastolic dysfunction LDL 84 HgbA1c 5.6 VTE prophylaxis -Lovenox    Diet   Diet regular Fluid consistency: Thin   No antithrombotics prior to admission, now on aspirin 81 mg daily and Plavix 75 mg daily for 3 weeks then aspirin alone Need outpatient neurology follow-up in 4 to 8 weeks Therapy recommendations: Pending Disposition: Pending  Hypertension Home meds: Bystolic 5 mg, lisinopril 5 mg Stable Permissive hypertension (OK if < 220/120) but gradually normalize in 5-7 days Restart home lisinopril.  May consider increasing to maintain BP goal Long-term BP goal normotensive  Hyperlipidemia Home meds: Crestor 5 mg and Zetia 10 mg, resumed in hospital (states was not taking these medications) LDL 84, goal < 70 Continue Crestor, increase to 10 mg Continue statin at discharge   Other Stroke Risk Factors Advanced Age >/= 39   Other Active Problems Hypothyroidism  From stroke standpoint patient may be discharged home.  Neurology will sign off please call with questions or concerns  Hospital day # 0  Gevena Mart DNP, ACNPC-AG  [facility]  ATTENDING ATTESTATION:  Is a patient with TIA.  MRI is negative.  Echo is unremarkable.  Stroke workup complete.  She can follow-up with [facility] in stroke clinic 3 to 4 weeks after discharge.  No driving until cleared by neurology outpatient.  DAPT therapy for 3 weeks then aspirin alone.  Blood pressure management per primary recommended restarting home medicines.  Will you sign off please call questions.  Plan discussed with Dr. Marland Mcalpine  Dr. Viviann Spare evaluated pt independently, reviewed  imaging, chart, labs. Discussed and formulated plan with the Resident/APP. Changes were made to the note where appropriate. Please see APP/resident note above for details.        Jerrod Damiano,MD    To contact Stroke Continuity provider, please refer to [email or web address]. After hours, contact General Neurology

## 2022-06-08 ENCOUNTER — Telehealth: Payer: Self-pay | Admitting: Cardiology

## 2022-06-08 NOTE — Telephone Encounter (Signed)
Pt's daughter in law would like a callback from nurse regarding f/u appt. Please advise

## 2022-06-08 NOTE — Telephone Encounter (Signed)
Spoke  to daughter in Social worker . She would  like for patient to be  re-establish with Dr [place] . She states patient blood pressure has been fluctuating and she is would like Dr [place] to manage -   patient was seeing a cardiologist - [facility]  Appointment made for July  And also place on the waitlist

## 2022-06-29 ENCOUNTER — Encounter: Payer: Self-pay | Admitting: Neurology

## 2022-06-29 ENCOUNTER — Ambulatory Visit (INDEPENDENT_AMBULATORY_CARE_PROVIDER_SITE_OTHER): Payer: Medicare Other | Admitting: Neurology

## 2022-06-29 ENCOUNTER — Other Ambulatory Visit: Payer: Self-pay | Admitting: Neurology

## 2022-06-29 VITALS — BP 135/76 | HR 65 | Ht 60.0 in | Wt 107.2 lb

## 2022-06-29 DIAGNOSIS — T466X5A Adverse effect of antihyperlipidemic and antiarteriosclerotic drugs, initial encounter: Secondary | ICD-10-CM

## 2022-06-29 DIAGNOSIS — M791 Myalgia, unspecified site: Secondary | ICD-10-CM

## 2022-06-29 DIAGNOSIS — G459 Transient cerebral ischemic attack, unspecified: Secondary | ICD-10-CM | POA: Diagnosis not present

## 2022-06-29 MED ORDER — COENZYME Q10 30 MG PO CAPS: 200.0000 mg | ORAL_CAPSULE | Freq: Three times a day (TID) | ORAL | 0 refills | Status: DC

## 2022-06-29 MED ORDER — CLOPIDOGREL BISULFATE 75 MG PO TABS: 75.0000 mg | ORAL_TABLET | Freq: Every day | ORAL | 0 refills | Status: DC

## 2022-06-29 MED ORDER — ONE-A-DAY MENS PO TABS: 1.0000 | ORAL_TABLET | Freq: Every day | ORAL | 0 refills | Status: AC

## 2022-06-29 NOTE — Progress Notes (Signed)
[facility] [address] [place]. [place] [address] [phone]       OFFICE CONSULT NOTE  Ms. Shaunice Levitan Date of Birth:  08-Jul-1929 Medical Record Number:  [id number]   Referring MD: Debby Bud  Reason for Referral: TIA  HPI: Ms.  Glassco is a pleasant 89 year old Caucasian lady seen today for initial office consultation visit for TIA.  She is accompanied by her daughter.  History is obtained from them and review of electronic medical records.  I personally reviewed pertinent available imaging films in PACS.  She has past medical history of hypertension, hyperlipidemia, thyroid disease who presented on 06/05/2022 with sudden onset of binocular blurred vision, dizziness and left hand weakness and numbness.  The symptoms lasted 1 to 2 hours and then spontaneously resolved.  She stated that the left arm [place] from the elbow to the hand.  Her vision was blurred in both eyes and involve the entire visual fields but she did not lose complete vision.  Her admission labs were significant only for mild hyponatremia and mildly elevated TSH.  CT scan of the head was unremarkable.  CT angiogram showed no large vessel stenosis or occlusion.  MRI scan of the brain did not show an acute stroke only age-related changes of small vessel disease and mild generalized atrophy.  Echocardiogram showed ejection fraction of 60 to 65%.  Cardiac monitoring did not reveal any atrial fibrillation.  LDL cholesterol was 84 mg percent.  Hemoglobin A1c was 5.6.  She was started on aspirin Plavix for 3 weeks followed by aspirin alone.  Patient states she has done well since discharge.  She is tolerating aspirin with minor bruising but no bleeding.  She states her blood pressure is well-controlled.  She is tolerating Crestor well but does complain of leg and calf pain particularly at night and at times wakes up because of that.  She is still quite active and lives alone by herself.  She has slight unsteadiness  of gait for the last 6 months but has had no falls or injuries.  She still manages to walk a mile a day at least but has to stop a few times.  She has no prior history of strokes TIAs seizures or significant neurological problems.  According to the daughters cognition is great and she does not have significant memory problems and is quite independent in her day-to-day activities.  ROS:   14 system review of systems is positive for numbness, weakness, blurred vision, dizziness, gait imbalance, cough and leg pain and all other systems negative  PMH:  Past Medical History:  Diagnosis Date   Hyperlipidemia    Hypertension    Thyroid disease     Social History:  Social History   Socioeconomic History   Marital status: Married    Spouse name: Not on file   Number of children: 3   Years of education: Not on file   Highest education level: Not on file  Occupational History   Not on file  Tobacco Use   Smoking status: Never   Smokeless tobacco: Never  Substance and Sexual Activity   Alcohol use: Yes    Alcohol/week: 2.0 standard drinks of alcohol    Types: 2 Glasses of wine per week   Drug use: No   Sexual activity: Not on file  Other Topics Concern   Not on file  Social History Narrative   Not on file   Social Determinants of Health   Financial Resource Strain: Not on  file  Food Insecurity: Not on file  Transportation Needs: Not on file  Physical Activity: Not on file  Stress: Not on file  Social Connections: Not on file  Intimate Partner Violence: Not on file    Medications:   Current Outpatient Medications on File Prior to Visit  Medication Sig Dispense Refill   aspirin EC 81 MG tablet Take 1 tablet (81 mg total) by mouth daily. Swallow whole. 90 tablet 3   cholecalciferol (VITAMIN D) 1000 UNITS tablet Take 1,000 Units by mouth daily.     clopidogrel (PLAVIX) 75 MG tablet Take 1 tablet (75 mg total) by mouth daily. 21 tablet 0   levothyroxine (SYNTHROID, LEVOTHROID)  88 MCG tablet Take 88 mcg by mouth daily.     lisinopril (ZESTRIL) 5 MG tablet Take by mouth.     nebivolol (BYSTOLIC) 5 MG tablet Take 5 mg by mouth daily.     rosuvastatin (CRESTOR) 10 MG tablet Take 1 tablet (10 mg total) by mouth daily. 30 tablet 0   cetirizine (ZYRTEC) 10 MG tablet Take 10 mg by mouth daily. (Patient not taking: Reported on 06/29/2022)     No current facility-administered medications on file prior to visit.    Allergies:  No Known Allergies  Physical Exam General: well developed, well nourished pleasant elderly Caucasian lady, seated, in no evident distress Head: head normocephalic and atraumatic.   Neck: supple with no carotid or supraclavicular bruits Cardiovascular: regular rate and rhythm, no murmurs Musculoskeletal: no deformity Skin:  no rash/petichiae Vascular:  Normal pulses all extremities  Neurologic Exam Mental Status: Awake and fully alert. Oriented to place and time. Recent and remote memory intact. Attention span, concentration and fund of knowledge appropriate. Mood and affect appropriate.  Cranial Nerves: Fundoscopic exam reveals sharp disc margins. Pupils equal, briskly reactive to light. Extraocular movements full without nystagmus. Visual fields full to confrontation. Hearing slightly diminished bilaterally.. Facial sensation intact. Face, tongue, palate moves normally and symmetrically.  Motor: Normal bulk and tone. Normal strength in all tested extremity muscles. Sensory.: intact to touch , pinprick , position and vibratory sensation.  Coordination: Rapid alternating movements normal in all extremities. Finger-to-nose and heel-to-shin performed accurately bilaterally. Gait and Station: Arises from chair without difficulty. Stance is normal. Gait demonstrates normal stride length and balance .  Not able to heel, toe and tandem walk without difficulty.  Reflexes: 1+ and symmetric. Toes downgoing.   NIHSS  0 Modified Rankin  0   ASSESSMENT:  89 year old Caucasian lady with transient episode of bilateral blurred vision, dizziness and left hand paresthesias and weakness in May 2024 likely posterior circulation TIA from small vessel disease.  Vascular risk factors hyperlipidemia hypertension.  She also has statin myalgias.     PLAN:I had a long d/w patient and her daughter about her  recent TIA risk for recurrent stroke/TIAs, personally independently reviewed imaging studies and stroke evaluation results and answered questions.Continue clopidogrel 75 mg daily  for secondary stroke prevention and stop aspirin now and maintain strict control of hypertension with blood pressure goal below 130/90, diabetes with hemoglobin A1c goal below 6.5% and lipids with LDL cholesterol goal below 70 mg/dL. I also advised the patient to eat a healthy diet with plenty of whole grains, cereals, fruits and vegetables, exercise regularly and maintain ideal body weight .recommend she start taking CoQ.  200 mg daily to help with her calf pain which may be statin myalgia.  I also advised her to maintain adequate hydration drink plenty of fluids  take a multivitamin daily.  Followup in the future with me in 6 months or call earlier if necessary.  Greater than 50% time during this 45-minute consultation visit were spent on counseling and coordination of care about her TIA and discussion about TIA and stroke prevention and treatment and answering questions. Delia Heady, MD  Note: This document was prepared with digital dictation and possible smart phrase technology. Any transcriptional errors that result from this process are unintentional.

## 2022-06-29 NOTE — Telephone Encounter (Signed)
Dr Pearlean Brownie, you sent a Rx for Plavix 75 mg for 21 days only, for this pt today.  Would you like pt to continue on this medication? Pharmacy is requesting a 90 day supply. Please advise.

## 2022-06-29 NOTE — Patient Instructions (Signed)
I had a long d/w patient and her daughter about her  recent TIA risk for recurrent stroke/TIAs, personally independently reviewed imaging studies and stroke evaluation results and answered questions.Continue clopidogrel 75 mg daily  for secondary stroke prevention and stop aspirin now and maintain strict control of hypertension with blood pressure goal below 130/90, diabetes with hemoglobin A1c goal below 6.5% and lipids with LDL cholesterol goal below 70 mg/dL. I also advised the patient to eat a healthy diet with plenty of whole grains, cereals, fruits and vegetables, exercise regularly and maintain ideal body weight .recommend she start taking CoQ.  200 mg daily to help with her calf pain which may be statin myalgia.  I also advised her to maintain adequate hydration drink plenty of fluids take a multivitamin daily.  Followup in the future with me in 6 months or call earlier if necessary.  Stroke Prevention Some medical conditions and behaviors can lead to a higher chance of having a stroke. You can help prevent a stroke by eating healthy, exercising, not smoking, and managing any medical conditions you have. Stroke is a leading cause of functional impairment. Primary prevention is particularly important because a majority of strokes are first-time events. Stroke changes the lives of not only those who experience a stroke but also their family and other caregivers. How can this condition affect me? A stroke is a medical emergency and should be treated right away. A stroke can lead to brain damage and can sometimes be life-threatening. If a person gets medical treatment right away, there is a better chance of surviving and recovering from a stroke. What can increase my risk? The following medical conditions may increase your risk of a stroke: Cardiovascular disease. High blood pressure (hypertension). Diabetes. High cholesterol. Sickle cell disease. Blood clotting disorders (hypercoagulable  state). Obesity. Sleep disorders (obstructive sleep apnea). Other risk factors include: Being older than age 29. Having a history of blood clots, stroke, or mini-stroke (transient ischemic attack, TIA). Genetic factors, such as race, ethnicity, or a family history of stroke. Smoking cigarettes or using other tobacco products. Taking birth control pills, especially if you also use tobacco. Heavy use of alcohol or drugs, especially cocaine and methamphetamine. Physical inactivity. What actions can I take to prevent this? Manage your health conditions High cholesterol levels. Eating a healthy diet is important for preventing high cholesterol. If cholesterol cannot be managed through diet alone, you may need to take medicines. Take any prescribed medicines to control your cholesterol as told by your health care provider. Hypertension. To reduce your risk of stroke, try to keep your blood pressure below 130/80. Eating a healthy diet and exercising regularly are important for controlling blood pressure. If these steps are not enough to manage your blood pressure, you may need to take medicines. Take any prescribed medicines to control hypertension as told by your health care provider. Ask your health care provider if you should monitor your blood pressure at home. Have your blood pressure checked every year, even if your blood pressure is normal. Blood pressure increases with age and some medical conditions. Diabetes. Eating a healthy diet and exercising regularly are important parts of managing your blood sugar (glucose). If your blood sugar cannot be managed through diet and exercise, you may need to take medicines. Take any prescribed medicines to control your diabetes as told by your health care provider. Get evaluated for obstructive sleep apnea. Talk to your health care provider about getting a sleep evaluation if you snore a lot  or have excessive sleepiness. Make sure that any other  medical conditions you have, such as atrial fibrillation or atherosclerosis, are managed. Nutrition Follow instructions from your health care provider about what to eat or drink to help manage your health condition. These instructions may include: Reducing your daily calorie intake. Limiting how much salt (sodium) you use to 1,500 milligrams (mg) each day. Using only healthy fats for cooking, such as olive oil, canola oil, or sunflower oil. Eating healthy foods. You can do this by: Choosing foods that are high in fiber, such as whole grains, and fresh fruits and vegetables. Eating at least 5 servings of fruits and vegetables a day. Try to fill one-half of your plate with fruits and vegetables at each meal. Choosing lean protein foods, such as lean cuts of meat, poultry without skin, fish, tofu, beans, and nuts. Eating low-fat dairy products. Avoiding foods that are high in sodium. This can help lower blood pressure. Avoiding foods that have saturated fat, trans fat, and cholesterol. This can help prevent high cholesterol. Avoiding processed and prepared foods. Counting your daily carbohydrate intake.  Lifestyle If you drink alcohol: Limit how much you have to: 0-1 drink a day for women who are not pregnant. 0-2 drinks a day for men. Know how much alcohol is in your drink. In the [place] one drink equals one 12 oz bottle of beer ( ), one 5 oz glass of wine ( ), or one 1 oz glass of hard liquor (44mL). Do not use any products that contain nicotine or tobacco. These products include cigarettes, chewing tobacco, and vaping devices, such as e-cigarettes. If you need help quitting, ask your health care provider. Avoid secondhand smoke. Do not use drugs. Activity  Try to stay at a healthy weight. Get at least 30 minutes of exercise on most days, such as: Fast walking. Biking. Swimming. Medicines Take over-the-counter and prescription medicines only as told by your health care  provider. Aspirin or blood thinners (antiplatelets or anticoagulants) may be recommended to reduce your risk of forming blood clots that can lead to stroke. Avoid taking birth control pills. Talk to your health care provider about the risks of taking birth control pills if: You are over 51 years old. You smoke. You get very bad headaches. You have had a blood clot. Where to find more information American Stroke Association: www.strokeassociation.org Get help right away if: You or a loved one has any symptoms of a stroke. "BE FAST" is an easy way to remember the main warning signs of a stroke: B - Balance. Signs are dizziness, sudden trouble walking, or loss of balance. E - Eyes. Signs are trouble seeing or a sudden change in vision. F - Face. Signs are sudden weakness or numbness of the face, or the face or eyelid drooping on one side. A - Arms. Signs are weakness or numbness in an arm. This happens suddenly and usually on one side of the body. S - Speech. Signs are sudden trouble speaking, slurred speech, or trouble understanding what people say. T - Time. Time to call emergency services. Write down what time symptoms started. You or a loved one has other signs of a stroke, such as: A sudden, severe headache with no known cause. Nausea or vomiting. Seizure. These symptoms may represent a serious problem that is an emergency. Do not wait to see if the symptoms will go away. Get medical help right away. Call your local emergency services (911 in the [place] Do not drive yourself to  the hospital. Summary You can help to prevent a stroke by eating healthy, exercising, not smoking, limiting alcohol intake, and managing any medical conditions you may have. Do not use any products that contain nicotine or tobacco. These include cigarettes, chewing tobacco, and vaping devices, such as e-cigarettes. If you need help quitting, ask your health care provider. Remember "BE FAST" for warning signs of a  stroke. Get help right away if you or a loved one has any of these signs. This information is not intended to replace advice given to you by your health care provider. Make sure you discuss any questions you have with your health care provider. Document Revised: 12/08/2021 Document Reviewed: 12/08/2021 Elsevier Patient Education  2024 [employer].

## 2022-06-30 ENCOUNTER — Other Ambulatory Visit: Payer: Self-pay | Admitting: Neurology

## 2022-06-30 ENCOUNTER — Other Ambulatory Visit: Payer: Self-pay

## 2022-06-30 MED ORDER — CLOPIDOGREL BISULFATE 75 MG PO TABS: 75.0000 mg | ORAL_TABLET | Freq: Every day | ORAL | 3 refills | Status: DC

## 2022-07-02 ENCOUNTER — Encounter: Payer: Self-pay | Admitting: Neurology

## 2022-07-13 ENCOUNTER — Ambulatory Visit: Payer: Medicare Other | Admitting: Diagnostic Neuroimaging

## 2022-07-18 ENCOUNTER — Other Ambulatory Visit: Payer: Self-pay | Admitting: Neurology

## 2022-08-14 NOTE — Progress Notes (Unsigned)
Cardiology Office Note   Date:  08/18/2022   ID:  Jill Curtis, DOB [birth year], MRN [id number]  PCP:  Eartha Inch, MD  Cardiologist:   Naseem Varden [place], MD   Chief Complaint  Patient presents with   svt      History of Present Illness: Jill Curtis is a 89 y.o. female with history of SVT is seen at the request of Dr Cyndia Bent to reestablish cardiac care. Last seen here in August 2021.  She has a history of HTN, HLD, and thyroid disease. She had prior event monitor in 2019 while in [place] showing a few runs of SVT. This was part of a work up for possible TIAs. She had remote stress testing in 2013 that was normal. When I saw her in 2021 she had nonspecific complaints of fatigue. She has since been followed by Dr Cheral Bay with [facility] cardiology. Echo in April 2023 was normal. Event monitor showed rare brief SVT runs. Longest 7 beats asymptomatic.   She was admitted in May with a TIA. Had arm tingling and blurred vision. CT and MRI showed small vessel disease. Was switched from ASA to Plavix. Was seen in follow up  with Dr Pearlean Brownie. Is confused about her BP meds because bottles say to take twice a day but only taking once a day which is what DC summary says. Unclear if she is taking Zetia which is on her DC summary. Crestor was increased to 10 mg in hospital.    Past Medical History:  Diagnosis Date   Hyperlipidemia    Hypertension    Thyroid disease     Past Surgical History:  Procedure Laterality Date   BACK SURGERY     TONSILLECTOMY       Current Outpatient Medications  Medication Sig Dispense Refill   cephALEXin (KEFLEX) 500 MG capsule Take 500 mg by mouth 4 (four) times daily. Will stop on 08/21/2022     cetirizine (ZYRTEC) 10 MG tablet Take 10 mg by mouth daily.     cholecalciferol (VITAMIN D) 1000 UNITS tablet Take 1,000 Units by mouth daily.     clopidogrel (PLAVIX) 75 MG tablet TAKE 1 TABLET(75 MG) BY MOUTH DAILY 90 tablet 3   ezetimibe (ZETIA) 10 MG tablet Take  1 tablet (10 mg total) by mouth daily. 90 tablet 3   levothyroxine (SYNTHROID, LEVOTHROID) 88 MCG tablet Take 88 mcg by mouth daily.     lisinopril (ZESTRIL) 5 MG tablet Take by mouth.     nebivolol (BYSTOLIC) 5 MG tablet Take 5 mg by mouth daily.     rosuvastatin (CRESTOR) 10 MG tablet Take 1 tablet (10 mg total) by mouth daily. 30 tablet 0   co-enzyme Q-10 30 MG capsule Take 7 capsules (210 mg total) by mouth 3 (three) times daily. (Patient not taking: Reported on 08/18/2022) 1 capsule 0   multivitamin (ONE-A-DAY MEN'S) TABS tablet Take 1 tablet by mouth daily. (Patient not taking: Reported on 08/18/2022) 1 tablet 0   No current facility-administered medications for this visit.    Allergies:   Patient has no known allergies.    Social History:  The patient  reports that she has never smoked. She has never used smokeless tobacco. She reports current alcohol use of about 2.0 standard drinks of alcohol per week. She reports that she does not use drugs.   Family History:  The patient's family history is negative for CAD.   ROS:  Please see the history of present illness.  Otherwise, review of systems are positive for none.   All other systems are reviewed and negative.    PHYSICAL EXAM: VS:  BP (!) 140/82   Pulse 73   Ht 5' (1.524 m)   Wt 108 lb (49 kg)   SpO2 97%   BMI 21.09 kg/m  , BMI Body mass index is 21.09 kg/m. GEN: Well nourished, well developed, WF appears younger than stated age. in no acute distress  HEENT: normal  Neck: no JVD, carotid bruits, or masses Cardiac: RRR; no murmurs, rubs, or gallops,no edema  Respiratory:  clear to auscultation bilaterally, normal work of breathing GI: soft, nontender, nondistended, + BS MS: no deformity or atrophy  Skin: warm and dry, no rash Neuro:  Strength and sensation are intact Psych: euthymic mood, full affect   EKG:  EKG is not ordered today.   Recent Labs: 06/05/2022: ALT 15; TSH 4.832 06/06/2022: BUN 10; Creatinine, Ser  0.70; Hemoglobin 12.5; Platelets 161; Potassium 3.6; Sodium 138    Lipid Panel    Component Value Date/Time   CHOL 166 06/06/2022 0425   TRIG 42 06/06/2022 0425   HDL 74 06/06/2022 0425   CHOLHDL 2.2 06/06/2022 0425   VLDL 8 06/06/2022 0425   LDLCALC 84 06/06/2022 0425      Wt Readings from Last 3 Encounters:  08/18/22 108 lb (49 kg)  06/29/22 107 lb 3.2 oz (48.6 kg)  06/05/22 115 lb (52.2 kg)    Labs dated 05/31/19: albumin 2.4, otherwise CMET normal. TSH 0.373. plts 52K otherwise CBC normal. Noted platelet aggregation.  Dated 07/14/18: cholesterol 140, triglycerides 49, HDL 88, LDL 42.    Other studies Reviewed: Additional studies/ records that were reviewed today include:   Echo April 2023 normal  Patient was wearing extended 5 day Holter monitor starting on May 07, 2020.   Indication: Palpitations. SVT.   Patient remained in sinus rhythm with minimum HR 58 at 1:28 AM on day 6, max HR in sinus rhythm 104 at 4:27 PM on day 3.  Average HR 72.   Patient remained bradycardic 1.5%  of total time.   Patient remained tachycardic 0.2% of total time.   Few supraventricular ectopic beats noted representing  <1% of total beats.   5 runs of narrow complex tachycardia noted.  Patient remained asymptomatic during those episodes.  The longest episode 7 beat run with HR 129 at 3:32 AM on day 3.  The fastest episode was 3 beat run with heart rate 180 bpm at 11:16 AM on day 4.   Only 579 PVCs noted representing 0.1 % of total beats.  12 PVCs noted in bigeminal pattern, 156 PVCs noted in trigeminal pattern.   6 patient triggered episodes were recorded.  During those episodes strips revealed sinus rhythm with heart rate 70-83 with no significant arrhythmias noted.   The longest R to R interval 1.1 seconds at 1:28 AM on April 24.   Echo 06/06/22: IMPRESSIONS     1. Left ventricular ejection fraction, by estimation, is 60 to 65%. The  left ventricle has normal function. The left  ventricle has no regional  wall motion abnormalities. Left ventricular diastolic parameters are  consistent with Grade I diastolic  dysfunction (impaired relaxation).   2. Right ventricular systolic function is normal. The right ventricular  size is normal. There is normal pulmonary artery systolic pressure. The  estimated right ventricular systolic pressure is 18.1 mmHg.   3. The mitral valve is grossly normal. Trivial mitral valve  regurgitation. No evidence of mitral stenosis.   4. The aortic valve is tricuspid. Aortic valve regurgitation is not  visualized. No aortic stenosis is present.   5. The inferior vena cava is normal in size with greater than 50%  respiratory variability, suggesting right atrial pressure of 3 mmHg.   ASSESSMENT AND PLAN:  1.  TIA. Likely posterior per Dr Pearlean Brownie. Now on Plavix.  2.  HTN goal BP < 130/80. Will monitor at home on Bystolic 5 mg daily and lisinopril 5 mg daily. If not at goal would increase lisinopril.  4. HLD goal LDL < 70. Continue Crestor 10 mg daily and Zetia 10 mg daily. Check fasting lab in 1 month  Current medicines are reviewed at length with the patient today.  The patient does not have concerns regarding medicines.  The following changes have been made:  no change  Labs/ tests ordered today include: none  Orders Placed This Encounter  Procedures   Basic metabolic panel   Lipid panel   Hepatic function panel     Disposition:   FU with APP in 3 months  Signed, Eirik Schueler [place], MD  08/18/2022 9:43 AM    [facility] [address], [place], [place], [address] Phone [phone], Fax [phone]

## 2022-08-18 ENCOUNTER — Ambulatory Visit: Payer: Medicare Other | Attending: Cardiology | Admitting: Cardiology

## 2022-08-18 ENCOUNTER — Encounter: Payer: Self-pay | Admitting: Cardiology

## 2022-08-18 VITALS — BP 140/82 | HR 73 | Ht 60.0 in | Wt 108.0 lb

## 2022-08-18 DIAGNOSIS — G459 Transient cerebral ischemic attack, unspecified: Secondary | ICD-10-CM | POA: Insufficient documentation

## 2022-08-18 DIAGNOSIS — I10 Essential (primary) hypertension: Secondary | ICD-10-CM | POA: Diagnosis present

## 2022-08-18 DIAGNOSIS — E7800 Pure hypercholesterolemia, unspecified: Secondary | ICD-10-CM | POA: Insufficient documentation

## 2022-08-18 MED ORDER — EZETIMIBE 10 MG PO TABS: 10.0000 mg | ORAL_TABLET | Freq: Every day | ORAL | 3 refills | Status: DC

## 2022-08-18 NOTE — Patient Instructions (Signed)
Medication Instructions:  Take Zetia 10 mg  daily Take Lisinopril 5 mg daily Take Bystolic 5 mg daily Take all medications on list  *If you need a refill on your cardiac medications before your next appointment, please call your pharmacy*   Lab Work: Have bmet,lipid and hepatic panels done in 1 month    Testing/Procedures: None ordered   Follow-Up: At [facility], you and your health needs are our priority.  As part of our continuing mission to provide you with exceptional heart care, we have created designated Provider Care Teams.  These Care Teams include your primary Cardiologist (physician) and Advanced Practice Providers (APPs -  Physician Assistants and Nurse Practitioners) who all work together to provide you with the care you need, when you need it.  We recommend signing up for the patient portal called "MyChart".  Sign up information is provided on this After Visit Summary.  MyChart is used to connect with patients for Virtual Visits (Telemedicine).  Patients are able to view lab/test results, encounter notes, upcoming appointments, etc.  Non-urgent messages can be sent to your provider as well.   To learn more about what you can do with MyChart, go to [email or web address].    Your next appointment:  3 months    Provider:  Dr.Jordan's PA    Continue to monitor B/P daily and keep a log.Bring readings to next appointment

## 2022-09-28 ENCOUNTER — Telehealth: Payer: Self-pay | Admitting: Neurology

## 2022-09-28 DIAGNOSIS — R42 Dizziness and giddiness: Secondary | ICD-10-CM

## 2022-09-28 DIAGNOSIS — R2681 Unsteadiness on feet: Secondary | ICD-10-CM

## 2022-09-28 DIAGNOSIS — G459 Transient cerebral ischemic attack, unspecified: Secondary | ICD-10-CM

## 2022-09-28 NOTE — Addendum Note (Signed)
Addended by: Judi Cong on: 09/28/2022 11:49 AM   Modules accepted: Orders

## 2022-09-28 NOTE — Telephone Encounter (Signed)
Called the number listed on file which actually is the daughter in law, who is listed on the [facility]. She was unaware that the patient called in about this. I reviewed the medication can cause bruising which the daughter in law states she had already told her that. She wanted to get the patient seen sooner. States the patient is complaining of dizziness that is still present and bothersome. I questioned if Dr Pearlean Brownie had mentioned referring to PT for this. She said no. I moved up to sooner apt by a couple weeks and added the pt to the wait list. Advised we will go ahead and place a referral for vestibular rehab for dizziness.

## 2022-09-28 NOTE — Telephone Encounter (Signed)
Pt called wanting to speak to RN to discuss bruising type marks she is having. She states that on the bottle of the  clopidogrel (PLAVIX) 75 MG tablet it states that if this happens to call provider. Please advise.

## 2022-10-08 ENCOUNTER — Ambulatory Visit: Payer: Medicare Other | Admitting: Physical Therapy

## 2022-10-15 ENCOUNTER — Encounter: Payer: Self-pay | Admitting: Physical Therapy

## 2022-10-15 ENCOUNTER — Ambulatory Visit: Payer: Medicare Other | Attending: Neurology | Admitting: Physical Therapy

## 2022-10-15 ENCOUNTER — Other Ambulatory Visit: Payer: Self-pay

## 2022-10-15 VITALS — BP 140/74 | HR 65

## 2022-10-15 DIAGNOSIS — R2681 Unsteadiness on feet: Secondary | ICD-10-CM | POA: Diagnosis not present

## 2022-10-15 DIAGNOSIS — R42 Dizziness and giddiness: Secondary | ICD-10-CM | POA: Insufficient documentation

## 2022-10-15 DIAGNOSIS — G459 Transient cerebral ischemic attack, unspecified: Secondary | ICD-10-CM | POA: Insufficient documentation

## 2022-10-16 ENCOUNTER — Encounter: Payer: Self-pay | Admitting: Physical Therapy

## 2022-10-23 ENCOUNTER — Other Ambulatory Visit (HOSPITAL_BASED_OUTPATIENT_CLINIC_OR_DEPARTMENT_OTHER): Payer: Self-pay | Admitting: Physician Assistant

## 2022-10-23 DIAGNOSIS — I618 Other nontraumatic intracerebral hemorrhage: Secondary | ICD-10-CM

## 2022-10-30 ENCOUNTER — Ambulatory Visit (HOSPITAL_BASED_OUTPATIENT_CLINIC_OR_DEPARTMENT_OTHER): Payer: Medicare Other

## 2022-11-03 ENCOUNTER — Telehealth: Payer: Self-pay | Admitting: Neurology

## 2022-11-03 NOTE — Telephone Encounter (Signed)
Received a call from, [place], Kirstin Adelberger, PA-C, she had saw the patient and noticed some mental status changes which prompted a MRI to be ordered. Pt completed the MRI  through [facility]. She notes she is definitely having new onset expressive aphasia. MRI is in care everywhere. She is scheduled to have a follow up 11/25 but the pt having these changes and with the new MRI findings, the PA is worried the patient should be seen sooner. Advised Dr Pearlean Brownie is out of the office and that I will send this to the work in, Dr Lucia Gaskins to review in case something is needed before Dr Pearlean Brownie returns on Thursday. She was appreciative.     "INDICATION: micro hemorrhages with new mental status changes. Headaches, distorted vision in both eyes.  STUDY: MRI of the head without intravenous contrast performed on 10/29/2022 11:13 AM.  COMPARISON: 08/01/2019.  TECHNIQUE: Multiplanar, multisequence MR imaging of the brain was obtained without IV contrast.  Contrast: None.  FINDINGS: #  Skull/marrow/soft tissues: Unremarkable. #  Orbits: Prior bilateral lenticular extractions. The orbits are otherwise unremarkable. #  Sinuses/mastoid air cells: Unremarkable. #  Vessels: Normal flow voids within the major intracranial vessels. #  Brain: Interval increase in the number of the foci of susceptibility overlying the RIGHT frontal lobe, RIGHT parietal lobe, LEFT frontal lobe, LEFT parietal lobe, and LEFT temporal lobe. #  Redemonstrated are several abnormal increased T2-weighted signal intensities in subcortical and paratracheal white matter regions of both cerebral hemispheres. Again this is nonspecific and without significant interval change. #  Mild diffuse atrophy, unchanged. #  No acute infarction is identified. There is no extra-axial fluid collection or intracranial hemorrhage. There is no mass effect or midline shift. #  Additional comments: None.   IMPRESSION: 1.  No acute intracranial abnormality. 2.   Interval increase in the number foci susceptibility overlying both cerebral hemispheres (RIGHT greater than LEFT).  Differential diagnoses includes cerebral amyloid angiopathy versus chronic hypertension versus superficial siderosis if there is a history of trauma. 3.  Nonspecific mild white matter disease, unchanged.  Electronically Signed by: Ardyth Man MD, PHD on 11/02/2022 11:45 AM"

## 2022-11-04 NOTE — Telephone Encounter (Signed)
SOunds like it may be an amyloid spell, she has increased changes in the brain that may be cerebral amyloid angiopathy. I think we can defer to Dr. Pearlean Brownie on Thursday for management if he would like to stop the plavix or check an eeg. If she has any symptoms she should go to the emergency room asap.

## 2022-11-04 NOTE — Telephone Encounter (Signed)
Called the daughter to provide her with an update. Advised that Dr Pearlean Brownie will be returning to the office tomorrow. Advised I did bring the information from the PA to the attention of our work in Doctor, Dr Lucia Gaskins who did review and states that this sounds like she is having amyloid spell. Advised that she doesn't feel this is something that needs to be urgently addressed and she will defer to Dr Pearlean Brownie since he is returning tomorrow and see what his thoughts and recommendations may be

## 2022-11-09 ENCOUNTER — Ambulatory Visit: Payer: Medicare Other | Admitting: Nurse Practitioner

## 2022-11-09 ENCOUNTER — Other Ambulatory Visit: Payer: Medicare Other

## 2022-11-12 NOTE — Progress Notes (Signed)
Cardiology Office Note   Date:  11/24/2022   ID:  Jill Curtis, DOB [birth year], MRN [id number]  PCP:  Eartha Inch, MD  Cardiologist:   Porshea Janowski [place], MD   Chief Complaint  Patient presents with   Transient Ischemic Attack      History of Present Illness: Jill Curtis is a 89 y.o. female with history of SVT is seen  for follow up.  She has a history of HTN, HLD, and thyroid disease. She had prior event monitor in 2019 while in [place] showing a few runs of SVT. This was part of a work up for possible TIAs. She had remote stress testing in 2013 that was normal. When I saw her in 2021 she had nonspecific complaints of fatigue. She has since been followed by Dr Cheral Bay with [facility]. Echo in April 2023 was normal. Event monitor showed rare brief SVT runs. Longest 7 beats asymptomatic.   She was admitted in May with a TIA. Had arm tingling and blurred vision. CT and MRI showed small vessel disease. Was switched from ASA to Plavix. Was seen in follow up  with Dr Pearlean Brownie.   She is doing well on follow up. Still has some blurring of her vision at times that may last 20 minutes. No palpitations. Tolerating medication well. No dizziness or chest pain    Past Medical History:  Diagnosis Date   Hyperlipidemia    Hypertension    Thyroid disease     Past Surgical History:  Procedure Laterality Date   BACK SURGERY     TONSILLECTOMY       Current Outpatient Medications  Medication Sig Dispense Refill   levothyroxine (SYNTHROID) 75 MCG tablet Take 1 tablet (75 mcg total) by mouth daily before breakfast.     lisinopril (ZESTRIL) 5 MG tablet Take by mouth.     nebivolol (BYSTOLIC) 5 MG tablet Take 5 mg by mouth daily.     rosuvastatin (CRESTOR) 5 MG tablet Take 1 tablet (5 mg total) by mouth daily.     clopidogrel (PLAVIX) 75 MG tablet TAKE 1 TABLET(75 MG) BY MOUTH DAILY 90 tablet 3   ezetimibe (ZETIA) 10 MG tablet Take 1 tablet (10 mg total) by mouth daily. 90  tablet 3   multivitamin (ONE-A-DAY MEN'S) TABS tablet Take 1 tablet by mouth daily. (Patient not taking: Reported on 08/18/2022) 1 tablet 0   No current facility-administered medications for this visit.    Allergies:   Patient has no known allergies.    Social History:  The patient  reports that she has never smoked. She has never used smokeless tobacco. She reports current alcohol use of about 2.0 standard drinks of alcohol per week. She reports that she does not use drugs.   Family History:  The patient's family history is negative for CAD.   ROS:  Please see the history of present illness.   Otherwise, review of systems are positive for none.   All other systems are reviewed and negative.    PHYSICAL EXAM: VS:  BP 104/64 (BP Location: Left Arm, Patient Position: Sitting, Cuff Size: Normal)   Pulse 67   Ht 5\' 1"  (1.549 m)   Wt 106 lb (48.1 kg)   SpO2 92%   BMI 20.03 kg/m  , BMI Body mass index is 20.03 kg/m. GEN: Well nourished, well developed, WF appears younger than stated age. in no acute distress  HEENT: normal  Neck: no JVD, carotid bruits, or masses Cardiac: RRR; no  murmurs, rubs, or gallops,no edema  Respiratory:  clear to auscultation bilaterally, normal work of breathing GI: soft, nontender, nondistended, + BS MS: no deformity or atrophy  Skin: warm and dry, no rash Neuro:  Strength and sensation are intact Psych: euthymic mood, full affect   EKG:  EKG is not ordered today.   Recent Labs: 06/05/2022: TSH 4.832 06/06/2022: Hemoglobin 12.5; Platelets 161 09/23/2022: ALT 20; BUN 12; Creatinine, Ser 0.73; Potassium 4.4; Sodium 136    Lipid Panel    Component Value Date/Time   CHOL 130 09/23/2022 1315   TRIG 113 09/23/2022 1315   HDL 67 09/23/2022 1315   CHOLHDL 1.9 09/23/2022 1315   CHOLHDL 2.2 06/06/2022 0425   VLDL 8 06/06/2022 0425   LDLCALC 43 09/23/2022 1315      Wt Readings from Last 3 Encounters:  11/24/22 106 lb (48.1 kg)  08/18/22 108 lb (49 kg)   06/29/22 107 lb 3.2 oz (48.6 kg)    Labs dated 05/31/19: albumin 2.4, otherwise CMET normal. TSH 0.373. plts 52K otherwise CBC normal. Noted platelet aggregation.  Dated 07/14/18: cholesterol 140, triglycerides 49, HDL 88, LDL 42.    Other studies Reviewed: Additional studies/ records that were reviewed today include:   Echo April 2023 normal  Patient was wearing extended 5 day Holter monitor starting on May 07, 2020.   Indication: Palpitations. SVT.   Patient remained in sinus rhythm with minimum HR 58 at 1:28 AM on day 6, max HR in sinus rhythm 104 at 4:27 PM on day 3.  Average HR 72.   Patient remained bradycardic 1.5%  of total time.   Patient remained tachycardic 0.2% of total time.   Few supraventricular ectopic beats noted representing  <1% of total beats.   5 runs of narrow complex tachycardia noted.  Patient remained asymptomatic during those episodes.  The longest episode 7 beat run with HR 129 at 3:32 AM on day 3.  The fastest episode was 3 beat run with heart rate 180 bpm at 11:16 AM on day 4.   Only 579 PVCs noted representing 0.1 % of total beats.  12 PVCs noted in bigeminal pattern, 156 PVCs noted in trigeminal pattern.   6 patient triggered episodes were recorded.  During those episodes strips revealed sinus rhythm with heart rate 70-83 with no significant arrhythmias noted.   The longest R to R interval 1.1 seconds at 1:28 AM on April 24.   Echo 06/06/22: IMPRESSIONS     1. Left ventricular ejection fraction, by estimation, is 60 to 65%. The  left ventricle has normal function. The left ventricle has no regional  wall motion abnormalities. Left ventricular diastolic parameters are  consistent with Grade I diastolic  dysfunction (impaired relaxation).   2. Right ventricular systolic function is normal. The right ventricular  size is normal. There is normal pulmonary artery systolic pressure. The  estimated right ventricular systolic pressure is 18.1 mmHg.    3. The mitral valve is grossly normal. Trivial mitral valve  regurgitation. No evidence of mitral stenosis.   4. The aortic valve is tricuspid. Aortic valve regurgitation is not  visualized. No aortic stenosis is present.   5. The inferior vena cava is normal in size with greater than 50%  respiratory variability, suggesting right atrial pressure of 3 mmHg.   ASSESSMENT AND PLAN:  1.  TIA. Likely posterior per Dr Pearlean Brownie. Now on Plavix.  2.  HTN BP well controlled on Bystolic 5 mg daily and lisinopril 5 mg  daily.  4. HLD goal LDL < 70. Continue Crestor 5 mg daily and Zetia 10 mg daily. Last LDL 43.   Current medicines are reviewed at length with the patient today.  The patient does not have concerns regarding medicines.  The following changes have been made:  no change  Labs/ tests ordered today include: none  No orders of the defined types were placed in this encounter.    Disposition:   FU with [place] in one year   Signed, Madora Barletta [place], MD  11/24/2022 9:05 AM    [facility] [address] [place], [place], [address] Phone [phone], Fax [phone]

## 2022-11-24 ENCOUNTER — Ambulatory Visit: Payer: Medicare Other | Attending: Nurse Practitioner | Admitting: Cardiology

## 2022-11-24 ENCOUNTER — Encounter: Payer: Self-pay | Admitting: Cardiology

## 2022-11-24 VITALS — BP 104/64 | HR 67 | Ht 61.0 in | Wt 106.0 lb

## 2022-11-24 DIAGNOSIS — I10 Essential (primary) hypertension: Secondary | ICD-10-CM | POA: Diagnosis not present

## 2022-11-24 DIAGNOSIS — E7800 Pure hypercholesterolemia, unspecified: Secondary | ICD-10-CM | POA: Insufficient documentation

## 2022-11-24 DIAGNOSIS — G459 Transient cerebral ischemic attack, unspecified: Secondary | ICD-10-CM | POA: Insufficient documentation

## 2022-11-24 MED ORDER — LEVOTHYROXINE SODIUM 75 MCG PO TABS: 75.0000 ug | ORAL_TABLET | Freq: Every day | ORAL | Status: AC

## 2022-11-24 MED ORDER — ROSUVASTATIN CALCIUM 5 MG PO TABS: 5.0000 mg | ORAL_TABLET | Freq: Every day | ORAL | Status: DC

## 2022-11-24 NOTE — Patient Instructions (Signed)
Medication Instructions:  No changes  *If you need a refill on your cardiac medications before your next appointment, please call your pharmacy*   Lab Work: None needed    Testing/Procedures: None ordered    Follow-Up: At [facility], you and your health needs are our priority.  As part of our continuing mission to provide you with exceptional heart care, we have created designated Provider Care Teams.  These Care Teams include your primary Cardiologist (physician) and Advanced Practice Providers (APPs -  Physician Assistants and Nurse Practitioners) who all work together to provide you with the care you need, when you need it.  Your next appointment:   1 year(s)  Provider:   Dr. [place]

## 2022-12-14 ENCOUNTER — Ambulatory Visit: Payer: Medicare Other | Admitting: Neurology

## 2022-12-14 ENCOUNTER — Encounter: Payer: Self-pay | Admitting: Neurology

## 2022-12-14 VITALS — BP 156/73 | HR 75 | Ht 60.0 in | Wt 109.0 lb

## 2022-12-14 DIAGNOSIS — G3184 Mild cognitive impairment, so stated: Secondary | ICD-10-CM

## 2022-12-14 DIAGNOSIS — R413 Other amnesia: Secondary | ICD-10-CM

## 2022-12-14 MED ORDER — CEREFOLIN NAC 6-90.314-2-600 MG PO TABS: 1.0000 | ORAL_TABLET | Freq: Every day | ORAL | 3 refills | Status: DC

## 2022-12-14 NOTE — Patient Instructions (Signed)
I had a long d/w patient and her daughter about her  remote TIA , mild cognitive impairment and memory loss,risk for recurrent stroke/TIAs, personally independently reviewed imaging studies and stroke evaluation results and answered questions.Continue clopidogrel 75 mg daily  for secondary stroke prevention and stop aspirin now and maintain strict control of hypertension with blood pressure goal below 130/90, diabetes with hemoglobin A1c goal below 6.5% and lipids with LDL cholesterol goal below 70 mg/dL. I also advised the patient to eat a healthy diet with plenty of whole grains, cereals, fruits and vegetables, exercise regularly and maintain ideal body weight I advised the patient to increase participating in cognitively challenging activities like solving crossword puzzles, playing bridge and sudoku.  We also discussed memory compensation strategies.  Patient is not willing to consider a trial of medications like Aricept Namenda at this time.  I recommend she try Cerefolin NAC 1 tablet daily to help with her memory impairment..  Followup in the future with me in 6 months or call earlier if necessary.    Memory Compensation Strategies  Use "WARM" strategy.  W= write it down  A= associate it  R= repeat it  M= make a mental note  2.   You can keep a Glass blower/designer.  Use a 3-ring notebook with sections for the following: calendar, important names and phone numbers,  medications, doctors' names/phone numbers, lists/reminders, and a section to journal what you did  each day.   3.    Use a calendar to write appointments down.  4.    Write yourself a schedule for the day.  This can be placed on the calendar or in a separate section of the Memory Notebook.  Keeping a  regular schedule can help memory.  5.    Use medication organizer with sections for each day or morning/evening pills.  You may need help loading it  6.    Keep a basket, or pegboard by the door.  Place items that you need to take  out with you in the basket or on the pegboard.  You may also want to  include a message board for reminders.  7.    Use sticky notes.  Place sticky notes with reminders in a place where the task is performed.  For example: " turn off the  stove" placed by the stove, "lock the door" placed on the door at eye level, " take your medications" on  the bathroom mirror or by the place where you normally take your medications.  8.    Use alarms/timers.  Use while cooking to remind yourself to check on food or as a reminder to take your medicine, or as a  reminder to make a call, or as a reminder to perform another task, etc.

## 2022-12-14 NOTE — Progress Notes (Unsigned)
[facility] [address] [place]. [place] [address] [phone]       OFFICE CONSULT NOTE  [name] Date of Birth:  11/20/1929 Medical Record Number:  [id number]   Referring MD: Jill Curtis  Reason for Referral: TIA  HPI: Ms.  Jill Curtis is a pleasant 89 year old Caucasian lady seen today for initial office consultation visit for TIA.  She is accompanied by her daughter.  History is obtained from them and review of electronic medical records.  I personally reviewed pertinent available imaging films in PACS.  She has past medical history of hypertension, hyperlipidemia, thyroid disease who presented on 06/05/2022 with sudden onset of binocular blurred vision, dizziness and left hand weakness and numbness.  The symptoms lasted 1 to 2 hours and then spontaneously resolved.  She stated that the left arm Jill Curtis from the elbow to the hand.  Her vision was blurred in both eyes and involve the entire visual fields but she did not lose complete vision.  Her admission labs were significant only for mild hyponatremia and mildly elevated TSH.  CT scan of the head was unremarkable.  CT angiogram showed no large vessel stenosis or occlusion.  MRI scan of the brain did not show an acute stroke only age-related changes of small vessel disease and mild generalized atrophy.  Echocardiogram showed ejection fraction of 60 to 65%.  Cardiac monitoring did not reveal any atrial fibrillation.  LDL cholesterol was 84 mg percent.  Hemoglobin A1c was 5.6.  She was started on aspirin Plavix for 3 weeks followed by aspirin alone.  Patient states she has done well since discharge.  She is tolerating aspirin with minor bruising but no bleeding.  She states her blood pressure is well-controlled.  She is tolerating Crestor well but does complain of leg and calf pain particularly at night and at times wakes up because of that.  She is still quite active and lives alone by herself.  She has slight unsteadiness  of gait for the last 6 months but has had no falls or injuries.  She still manages to walk a mile a day at least but has to stop a few times.  She has no prior history of strokes TIAs seizures or significant neurological problems.  According to the daughters cognition is great and she does not have significant memory problems and is quite independent in her day-to-day activities.  ROS:   14 system review of systems is positive for numbness, weakness, blurred vision, dizziness, gait imbalance, cough and leg pain and all other systems negative  PMH:  Past Medical History:  Diagnosis Date   Hyperlipidemia    Hypertension    Thyroid disease     Social History:  Social History   Socioeconomic History   Marital status: Married    Spouse name: Not on file   Number of children: 3   Years of education: Not on file   Highest education level: Not on file  Occupational History   Not on file  Tobacco Use   Smoking status: Never   Smokeless tobacco: Never  Substance and Sexual Activity   Alcohol use: Yes    Alcohol/week: 2.0 standard drinks of alcohol    Types: 2 Glasses of wine per week   Drug use: No   Sexual activity: Not on file  Other Topics Concern   Not on file  Social History Narrative   Not on file   Social Determinants of Health   Financial Resource Strain: Low Risk  (  08/10/2022)   Received from [facility]   Overall Financial Resource Strain (CARDIA)    Difficulty of Paying Living Expenses: Not hard at all  Food Insecurity: No Food Insecurity (08/10/2022)   Received from [facility]   Hunger Vital Sign    Worried About Running Out of Food in the Last Year: Never true    Ran Out of Food in the Last Year: Never true  Transportation Needs: No Transportation Needs (08/10/2022)   Received from [facility] - Transportation    Lack of Transportation (Medical): No    Lack of Transportation (Non-Medical): No  Physical Activity: Sufficiently Active (08/10/2022)    Received from [facility]   Exercise Vital Sign    Days of Exercise per Week: 7 days    Minutes of Exercise per Session: 30 min  Stress: No Stress Concern Present (08/10/2022)   Received from [facility] - Occupational Stress Questionnaire    Feeling of Stress : Not at all  Social Connections: Socially Integrated (08/10/2022)   Received from [facility]   Social Network    How would you rate your social network (family, work, friends)?: Good participation with social networks  Intimate Partner Violence: Not At Risk (08/10/2022)   Received from [facility]   HITS    Over the last 12 months how often did your partner physically hurt you?: Never    Over the last 12 months how often did your partner insult you or talk down to you?: Never    Over the last 12 months how often did your partner threaten you with physical harm?: Never    Over the last 12 months how often did your partner scream or curse at you?: Never    Medications:   Current Outpatient Medications on File Prior to Visit  Medication Sig Dispense Refill   clopidogrel (PLAVIX) 75 MG tablet TAKE 1 TABLET(75 MG) BY MOUTH DAILY 90 tablet 3   levothyroxine (SYNTHROID) 75 MCG tablet Take 1 tablet (75 mcg total) by mouth daily before breakfast.     lisinopril (ZESTRIL) 5 MG tablet Take by mouth.     nebivolol (BYSTOLIC) 5 MG tablet Take 5 mg by mouth daily.     rosuvastatin (CRESTOR) 5 MG tablet Take 1 tablet (5 mg total) by mouth daily.     ezetimibe (ZETIA) 10 MG tablet Take 1 tablet (10 mg total) by mouth daily. 90 tablet 3   multivitamin (ONE-A-DAY MEN'S) TABS tablet Take 1 tablet by mouth daily. (Patient not taking: Reported on 08/18/2022) 1 tablet 0   No current facility-administered medications on file prior to visit.    Allergies:   Allergies  Allergen Reactions   Atorvastatin Other (See Comments)   Codeine Other (See Comments)    Physical Exam General: well developed,  well nourished pleasant elderly Caucasian lady, seated, in no evident distress Head: head normocephalic and atraumatic.   Neck: supple with no carotid or supraclavicular bruits Cardiovascular: regular rate and rhythm, no murmurs Musculoskeletal: no deformity Skin:  no rash/petichiae Vascular:  Normal pulses all extremities  Neurologic Exam Mental Status: Awake and fully alert. Oriented to place and time. Recent and remote memory intact. Attention span, concentration and fund of knowledge appropriate. Mood and affect appropriate.  Cranial Nerves: Fundoscopic exam reveals sharp disc margins. Pupils equal, briskly reactive to light. Extraocular movements full without nystagmus. Visual fields full to confrontation. Hearing slightly diminished bilaterally.. Facial sensation intact. Face, tongue,  palate moves normally and symmetrically.  Motor: Normal bulk and tone. Normal strength in all tested extremity muscles. Sensory.: intact to touch , pinprick , position and vibratory sensation.  Coordination: Rapid alternating movements normal in all extremities. Finger-to-nose and heel-to-shin performed accurately bilaterally. Gait and Station: Arises from chair without difficulty. Stance is normal. Gait demonstrates normal stride length and balance .  Not able to heel, toe and tandem walk without difficulty.  Reflexes: 1+ and symmetric. Toes downgoing.   NIHSS  0 Modified Rankin  0   ASSESSMENT: 89 year old Caucasian lady with transient episode of bilateral blurred vision, dizziness and left hand paresthesias and weakness in May 2024 likely posterior circulation TIA from small vessel disease.  Vascular risk factors hyperlipidemia hypertension.  She also has statin myalgias.  New complaints of memory loss and mild cognitive impairment which appears age-appropriate.     PLAN:I had a long d/w patient and her daughter about her  remote TIA , mild cognitive impairment and memory loss,risk for recurrent  stroke/TIAs, personally independently reviewed imaging studies and stroke evaluation results and answered questions.Continue clopidogrel 75 mg daily  for secondary stroke prevention and stop aspirin now and maintain strict control of hypertension with blood pressure goal below 130/90, diabetes with hemoglobin A1c goal below 6.5% and lipids with LDL cholesterol goal below 70 mg/dL. I also advised the patient to eat a healthy diet with plenty of whole grains, cereals, fruits and vegetables, exercise regularly and maintain ideal body weight I advised the patient to increase participating in cognitively challenging activities like solving crossword puzzles, playing bridge and sudoku.  We also discussed memory compensation strategies.  Patient is not willing to consider a trial of medications like Aricept Namenda at this time.  I recommend she try Cerefolin NAC 1 tablet daily to help with her memory impairment..  Followup in the future with me in 6 months or call earlier if necessary.  Greater than 50% time during this 45-minute  visit were spent on counseling and coordination of care about her TIA and discussion about TIA and stroke prevention and treatment and answering questions. Delia Heady, MD  Note: This document was prepared with digital dictation and possible smart phrase technology. Any transcriptional errors that result from this process are unintentional.

## 2022-12-31 ENCOUNTER — Ambulatory Visit: Payer: Medicare Other | Admitting: Neurology

## 2023-05-04 ENCOUNTER — Telehealth: Payer: Self-pay | Admitting: Neurology

## 2023-05-04 NOTE — Telephone Encounter (Signed)
 Pt stopped in to see when next appt was. Also she would like to speak with Dr. Janett Medin regarding Cerefolin NAC pills she is taking; says her memory is no better than it was 3 years ago. She is paying $200 a month for these pills. Would like to discuss.

## 2023-05-10 NOTE — Telephone Encounter (Signed)
 Attempted to call Pt. No answer, LVM

## 2023-05-12 ENCOUNTER — Other Ambulatory Visit: Payer: Self-pay

## 2023-05-12 NOTE — Telephone Encounter (Signed)
 Spoke w/Pt to make her aware Dr. Janett Medin stated if she is not getting benefit from the mediation she can stop taking it. Pt stated she does not feel it is helping her and the cost is too much. Pt stated she does not want to continue the medication. Pt voiced thanks for the call and making Dr. Janett Medin aware.

## 2023-05-20 ENCOUNTER — Telehealth: Payer: Self-pay | Admitting: Neurology

## 2023-05-20 NOTE — Telephone Encounter (Signed)
 error

## 2023-06-06 ENCOUNTER — Other Ambulatory Visit: Payer: Self-pay | Admitting: Cardiology

## 2023-08-15 ENCOUNTER — Other Ambulatory Visit: Payer: Self-pay | Admitting: Neurology

## 2023-08-19 NOTE — Telephone Encounter (Signed)
 Pharmacy [phone] has called back to check on the status of the refill request for clopidogrel  (PLAVIX ) 75 MG tablet

## 2023-08-25 ENCOUNTER — Ambulatory Visit: Payer: Medicare Other | Admitting: Neurology

## 2023-11-04 NOTE — Progress Notes (Signed)
 [facility] [address] [place]. [place] [address] [phone]       OFFICE FOLLOW UP NOTE  Jill Curtis Date of Birth:  [birth year] Medical Record Number:  [id number]    Primary neurologist: Dr. Rosemarie Reason for visit: hx of TIA, MCI   Chief Complaint  Patient presents with   RM 8    Here with daughter and son for stroke and memory follow-up. Feels she's doing well overall. Family notices a few issues. MMSE 25/30 Animals 6      HPI:   Update 11/05/2023 JM: Patient returns for follow-up visit after prior visit with Dr. Rosemarie almost 1 year ago on 12/14/2022 with complaints of mild cognitive impairment which was felt to be age-related (only noted in A/P section).  She is accompanied today by her son and daughter.  MMSE 27/30.  She declined interest in Aricept or Namenda and was started on Cerefolin.  This was discontinued in April due to financial reasons and denied benefit.  Currently, she believes her cognition overall has been stable but continues to struggle more so short-term memory.  She continues to maintain ADLs independently and maintains majority of IADLs.  She does live alone but has aide assistance 4 hours/day 5 days/week and has very supportive family who assist as needed.  She also mentions experiencing episodes where she would become very sleepy usually right before lunch.  Reports this would usually occur in setting of bright light exposure, get a frontal headache and would need to lay down. She was recently seen at [facility], per patient no specific input regarding these symptoms and was told to just ensure she wears her sunglasses when outside. Has not had any further issues with this over the past several weeks. Daughter mentions episode of left arm numbness, garbled speech and confusion about 4-5 months ago. After episode, she felt very tired and needed to lay down.  Upon further discussion, she does endorse having blurred vision during the  episodes after bright light exposure as noted above.  Unclear if she has recurrent arm numbness and speech impairment during the above episodes. She denies having hx of migraines or issues with headaches.   She does report compliance on Plavix  but complains of excessive distal upper and lower extremity bruising.  She denies taking aspirin  consistently prior to her TIA, she would only take occasionally as needed.  She denies current use of aspirin .  Also remains on Zetia  and Crestor .  Routinely follows with PCP for stroke risk factor management.      Consult visit 06/29/2022 Dr. Rosemarie: Ms.  Jill Curtis is a pleasant 89 year old Caucasian lady seen today for initial office consultation visit for TIA.  She is accompanied by her daughter.  History is obtained from them and review of electronic medical records.  I personally reviewed pertinent available imaging films in PACS.  She has past medical history of hypertension, hyperlipidemia, thyroid  disease who presented on 06/05/2022 with sudden onset of binocular blurred vision, dizziness and left hand weakness and numbness.  The symptoms lasted 1 to 2 hours and then spontaneously resolved.  She stated that the left arm [place] from the elbow to the hand.  Her vision was blurred in both eyes and involve the entire visual fields but she did not lose complete vision.  Her admission labs were significant only for mild hyponatremia and mildly elevated TSH.  CT scan of the head was unremarkable.  CT angiogram showed no large vessel stenosis or occlusion.  MRI  scan of the brain did not show an acute stroke only age-related changes of small vessel disease and mild generalized atrophy.  Echocardiogram showed ejection fraction of 60 to 65%.  Cardiac monitoring did not reveal any atrial fibrillation.  LDL cholesterol was 84 mg percent.  Hemoglobin A1c was 5.6.  She was started on aspirin  Plavix  for 3 weeks followed by aspirin  alone.  Patient states she has done well since  discharge.  She is tolerating aspirin  with minor bruising but no bleeding.  She states her blood pressure is well-controlled.  She is tolerating Crestor  well but does complain of leg and calf pain particularly at night and at times wakes up because of that.  She is still quite active and lives alone by herself.  She has slight unsteadiness of gait for the last 6 months but has had no falls or injuries.  She still manages to walk a mile a day at least but has to stop a few times.  She has no prior history of strokes TIAs seizures or significant neurological problems.  According to the daughters cognition is great and she does not have significant memory problems and is quite independent in her day-to-day activities.    ROS:   14 system review of systems is positive for those listed in HPI and all other systems negative  PMH:  Past Medical History:  Diagnosis Date   Hyperlipidemia    Hypertension    Thyroid  disease     Social History:  Social History   Socioeconomic History   Marital status: Married    Spouse name: Not on file   Number of children: 3   Years of education: Not on file   Highest education level: Not on file  Occupational History   Not on file  Tobacco Use   Smoking status: Never   Smokeless tobacco: Never  Substance and Sexual Activity   Alcohol use: Yes    Alcohol/week: 2.0 standard drinks of alcohol    Types: 2 Glasses of wine per week    Comment: more like 2 drinks per month per family   Drug use: No   Sexual activity: Not on file  Other Topics Concern   Not on file  Social History Narrative   Lives alone   Right handed   Social Drivers of Health   Financial Resource Strain: Low Risk  (09/13/2023)   Received from [facility]   Overall Financial Resource Strain (CARDIA)    How hard is it for you to pay for the very basics like food, housing, medical care, and heating?: Not hard at all  Food Insecurity: No Food Insecurity (09/13/2023)   Received from  [facility]   Hunger Vital Sign    Within the past 12 months, you worried that your food would run out before you got the money to buy more.: Never true    Within the past 12 months, the food you bought just didn't last and you didn't have money to get more.: Never true  Transportation Needs: No Transportation Needs (09/13/2023)   Received from [facility] - Transportation    In the past 12 months, has lack of transportation kept you from medical appointments or from getting medications?: No    In the past 12 months, has lack of transportation kept you from meetings, work, or from getting things needed for daily living?: No  Physical Activity: Sufficiently Active (09/13/2023)   Received from [facility]   Exercise Vital Sign  On average, how many days per week do you engage in moderate to strenuous exercise (like a brisk walk)?: 7 days    On average, how many minutes do you engage in exercise at this level?: 40 min  Stress: No Stress Concern Present (09/13/2023)   Received from [facility] - Occupational Stress Questionnaire    Do you feel stress - tense, restless, nervous, or anxious, or unable to sleep at night because your mind is troubled all the time - these days?: Not at all  Social Connections: Moderately Integrated (09/13/2023)   Received from [facility]   Social Network    How would you rate your social network (family, work, friends)?: Adequate participation with social networks  Intimate Partner Violence: Not At Risk (09/13/2023)   Received from [facility]   HITS    Over the last 12 months how often did your partner physically hurt you?: Never    Over the last 12 months how often did your partner insult you or talk down to you?: Never    Over the last 12 months how often did your partner threaten you with physical harm?: Never    Over the last 12 months how often did your partner scream or curse at you?: Never     Medications:   Current Outpatient Medications on File Prior to Visit  Medication Sig Dispense Refill   clopidogrel  (PLAVIX ) 75 MG tablet TAKE 1 TABLET(75 MG) BY MOUTH DAILY 90 tablet 3   ezetimibe  (ZETIA ) 10 MG tablet TAKE 1 TABLET(10 MG) BY MOUTH DAILY 90 tablet 2   levothyroxine  (SYNTHROID ) 75 MCG tablet Take 1 tablet (75 mcg total) by mouth daily before breakfast.     lisinopril  (ZESTRIL ) 5 MG tablet Take by mouth.     nebivolol (BYSTOLIC) 5 MG tablet Take 5 mg by mouth daily.     rosuvastatin  (CRESTOR ) 5 MG tablet Take 1 tablet (5 mg total) by mouth daily.     multivitamin (ONE-A-DAY MEN'S) TABS tablet Take 1 tablet by mouth daily. (Patient not taking: Reported on 11/05/2023) 1 tablet 0   No current facility-administered medications on file prior to visit.    Allergies:   Allergies  Allergen Reactions   Atorvastatin Other (See Comments)   Codeine Other (See Comments)    Physical Exam Today's Vitals   11/05/23 0953  BP: 130/73  Pulse: 70  Weight: 107 lb (48.5 kg)  Height: 5' (1.524 m)   Body mass index is 20.9 kg/m.   General: well developed, well nourished very pleasant elderly Caucasian lady, seated, in no evident distress Skin:  left leg bruise on shin, left hand bruise    Neurologic Exam Mental Status: Awake and fully alert.  Fluent speech and language.  Oriented to place and time. Recent memory impaired and remote memory intact. Attention span, concentration and fund of knowledge mostly appropriate with family providing some history. Mood and affect appropriate.  Cranial Nerves: Pupils equal, briskly reactive to light. Extraocular movements full without nystagmus. Visual fields full to confrontation. Hearing slightly diminished bilaterally.. Facial sensation intact. Face, tongue, palate moves normally and symmetrically.  Motor: Normal bulk and tone. Normal strength in all tested extremity muscles. Sensory.: intact to touch , pinprick , position and vibratory  sensation.  Coordination: Rapid alternating movements normal in all extremities. Finger-to-nose and heel-to-shin performed accurately bilaterally. Gait and Station: Arises from chair without difficulty. Stance is normal. 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  Vascular risk factors hyperlipidemia hypertension.  She also has statin myalgias.  New complaints of memory loss and mild cognitive impairment which appears age-appropriate.  She reports recurrent episodes of bilateral blurred vision, slurred speech, confusion, left hand numbness and occasional frontal headache with significant fatigue after over the past several months. Prior MR brain 05/2022 showed chronic microhemorrhages possibly due to chronic hypertension vs cerebral amyloid angiopathy     PLAN:  Transient neurological episodes: No recurrent episodes over the past 6 weeks DDx partial seizure vs complicated migraine vs possible amyloid spell; less suspicion for TIA/stroke due to recurrent multiple episodes Discussed further evaluation including EEG or trial of ASM but declines interest at this time. Reports episodes do not affect her quality of life and would not be interested in adding any other medications at this time. She and her family will further discuss and call office if interested in pursuing Can consider repeat  MR brain although again low suspicion for TIA/stroke, can consider in the future if needed or with change of characteristics Will forward note to Dr. Rosemarie for review and any further input ADDENDUM 11/09/2023: per Dr. Rosemarie, If they are all the same and with headache could be migraines and if frequent enough can try low dose depakote or topamax. As noted above, this was discussed with patient and family but declined interest in pursuing. They are aware to call if interested in starting prior to follow up visit.   TIA Start aspirin  81mg  daily and stop plavix  d/t complaints of excessive bruising. She was not taking aspirin  consistently prior to TIA  Continue Crestor  and Zetia  10 mg daily managed/prescribed by PCP Continue close PCP follow-up for aggressive stroke risk factor management  Mild cognitive impairment MMSE today 25/30, prior 27/30 Subjectively feels cognition stable.  Continues to maintain ADLs and majority of IADLs independently Declines interest in any additional medication at this time Discussed importance of routine cognitive and physical exercises as well as ensuring good sleep, healthy diet and routine socialization    Follow-up with Dr. Rosemarie in 6 to 9 months for further evaluation of transient neurological episodes but advised to call sooner with any questions or concerns     I personally spent a total of 50 minutes in the care of the patient today including preparing to see the patient, getting/reviewing separately obtained history, performing a medically appropriate exam/evaluation, counseling and educating, placing orders, referring and communicating with other health care professionals, and documenting clinical information in the EHR.  Harlene Bogaert, AGNP-BC  [facility] [address] [place], [place] [address]  Phone [phone] Fax [phone] Note: This document was prepared with digital dictation and possible smart phrase  technology. Any transcriptional errors that result from this process are unintentional.

## 2023-11-05 ENCOUNTER — Ambulatory Visit: Admitting: Adult Health

## 2023-11-05 ENCOUNTER — Encounter: Payer: Self-pay | Admitting: Adult Health

## 2023-11-05 VITALS — BP 130/73 | HR 70 | Ht 60.0 in | Wt 107.0 lb

## 2023-11-05 DIAGNOSIS — G459 Transient cerebral ischemic attack, unspecified: Secondary | ICD-10-CM

## 2023-11-05 DIAGNOSIS — R2990 Unspecified symptoms and signs involving the nervous system: Secondary | ICD-10-CM

## 2023-11-05 DIAGNOSIS — G3184 Mild cognitive impairment, so stated: Secondary | ICD-10-CM | POA: Diagnosis not present

## 2023-11-05 MED ORDER — ASPIRIN 81 MG PO TBEC: 81.0000 mg | DELAYED_RELEASE_TABLET | Freq: Every day | ORAL | Status: AC

## 2023-11-05 NOTE — Patient Instructions (Signed)
 Continue to monitor the episodes of arm numbness, vision changes and fatigue. If these should start to become more frequent or you would like to further evaluate these, please let me know.   Start aspirin  81 mg daily and stop plavix  and continue Crestor  and Zetia   for secondary stroke prevention  Continue to follow up with PCP regarding blood pressure and cholesterol management  Maintain strict control of hypertension with blood pressure goal below 130/90 and cholesterol with LDL cholesterol (bad cholesterol) goal below 70 mg/dL.   Signs of a Stroke? Follow the BEFAST method:  Balance Watch for a sudden loss of balance, trouble with coordination or vertigo Eyes Is there a sudden loss of vision in one or both eyes? Or double vision?  Face: Ask the person to smile. Does one side of the face droop or is it numb?  Arms: Ask the person to raise both arms. Does one arm drift downward? Is there weakness or numbness of a leg? Speech: Ask the person to repeat a simple phrase. Does the speech sound slurred/strange? Is the person confused ? Time: If you observe any of these signs, call 911.     Followup in the future with me in 6-9 months with Dr. Rosemarie or call earlier if needed       Thank you for coming to see us  at [facility]. I hope we have been able to provide you high quality care today.  You may receive a patient satisfaction survey over the next few weeks. We would appreciate your feedback and comments so that we may continue to improve ourselves and the health of our patients.

## 2023-11-06 NOTE — Progress Notes (Signed)
 I agree with the above plan

## 2024-01-15 ENCOUNTER — Other Ambulatory Visit: Payer: Self-pay | Admitting: Cardiology

## 2024-01-21 ENCOUNTER — Other Ambulatory Visit: Payer: Self-pay | Admitting: Cardiology

## 2024-01-24 NOTE — Progress Notes (Signed)
 "    Cardiology Office Note   Date:  01/27/2024   ID:  Jill Curtis, DOB Oct 05, 1929, MRN [id number]  PCP:  Sophronia Ozell BROCKS, MD  Cardiologist:   Rodrick Payson, MD   No chief complaint on file.     History of Present Illness: Jill Curtis is a 89 y.o. female with history of SVT is seen  for follow up.  She has a history of HTN, HLD, and thyroid  disease. She had prior event monitor in 2019 while in [place] showing a few runs of SVT. This was part of a work up for possible TIAs. She had remote stress testing in 2013 that was normal.  Echo in April 2023 was normal. Event monitor showed rare brief SVT runs. Longest 7 beats asymptomatic.   She was admitted in May 2024 with a TIA. Had arm tingling and blurred vision. CT and MRI showed small vessel disease. Was switched from ASA to Plavix . Was seen in follow up  with Dr Rosemarie.   She is doing well on follow up. Still has some blurring of her vision at times. No palpitations. Tolerating medication well. No dizziness or chest pain. Reports plavix  was switched back to ASA due to excessive bruising.     Past Medical History:  Diagnosis Date   Hyperlipidemia    Hypertension    Thyroid  disease     Past Surgical History:  Procedure Laterality Date   BACK SURGERY     TONSILLECTOMY       Current Outpatient Medications  Medication Sig Dispense Refill   aspirin  EC 81 MG tablet Take 1 tablet (81 mg total) by mouth daily. Swallow whole.     levothyroxine  (SYNTHROID ) 75 MCG tablet Take 1 tablet (75 mcg total) by mouth daily before breakfast.     lisinopril  (ZESTRIL ) 5 MG tablet Take by mouth.     ezetimibe  (ZETIA ) 10 MG tablet Take 1 tablet (10 mg total) by mouth daily. 90 tablet 3   multivitamin (ONE-A-DAY MEN'S) TABS tablet Take 1 tablet by mouth daily. (Patient not taking: Reported on 01/27/2024) 1 tablet 0   nebivolol  (BYSTOLIC ) 5 MG tablet Take 1 tablet (5 mg total) by mouth daily. 90 tablet 3   rosuvastatin  (CRESTOR ) 5 MG tablet Take 1  tablet (5 mg total) by mouth daily. 90 tablet 3   No current facility-administered medications for this visit.    Allergies:   Atorvastatin and Codeine    Social History:  The patient  reports that she has never smoked. She has never used smokeless tobacco. She reports current alcohol use of about 2.0 standard drinks of alcohol per week. She reports that she does not use drugs.   Family History:  The patient's family history is negative for CAD.   ROS:  Please see the history of present illness.   Otherwise, review of systems are positive for none.   All other systems are reviewed and negative.    PHYSICAL EXAM: VS:  BP (!) 142/80 (BP Location: Left Arm, Patient Position: Sitting, Cuff Size: Small)   Pulse 69   Ht 5' (1.524 m)   Wt 107 lb (48.5 kg)   SpO2 94%   BMI 20.90 kg/m  , BMI Body mass index is 20.9 kg/m. GEN: Well nourished, well developed, WF appears younger than stated age. in no acute distress  HEENT: normal  Neck: no JVD, carotid bruits, or masses Cardiac: RRR; no murmurs, rubs, or gallops,no edema  Respiratory:  clear to auscultation bilaterally, normal  work of breathing GI: soft, nontender, nondistended, + BS MS: no deformity or atrophy  Skin: warm and dry, no rash Neuro:  Strength and sensation are intact Psych: euthymic mood, full affect   EKG Interpretation Date/Time:  Thursday January 27 2024 10:53:11 EST Ventricular Rate:  69 PR Interval:  156 QRS Duration:  72 QT Interval:  394 QTC Calculation: 422 R Axis:   8  Text Interpretation: Normal sinus rhythm Normal ECG When compared with ECG of 05-Jun-2022 16:58, No significant change was found Confirmed by Juandaniel Manfredo [phone]) on 01/27/2024 11:00:15 AM    Recent Labs: No results found for requested labs within last 365 days.    Lipid Panel    Component Value Date/Time   CHOL 130 09/23/2022 1315   TRIG 113 09/23/2022 1315   HDL 67 09/23/2022 1315   CHOLHDL 1.9 09/23/2022 1315   CHOLHDL 2.2  06/06/2022 0425   VLDL 8 06/06/2022 0425   LDLCALC 43 09/23/2022 1315      Wt Readings from Last 3 Encounters:  01/27/24 107 lb (48.5 kg)  11/05/23 107 lb (48.5 kg)  12/14/22 109 lb (49.4 kg)    Labs dated 05/31/19: albumin 2.4, otherwise CMET normal. TSH 0.373. plts 52K otherwise CBC normal. Noted platelet aggregation.  Dated 07/14/18: cholesterol 140, triglycerides 49, HDL 88, LDL 42.  Dated 09/14/23: cholesterol 140, triglycerides 69, HDL 74, LDL 52. CBC, CMET and TSH normal.    Other studies Reviewed: Additional studies/ records that were reviewed today include:   Echo April 2023 normal  Patient was wearing extended 5 day Holter monitor starting on May 07, 2020.   Indication: Palpitations. SVT.   Patient remained in sinus rhythm with minimum HR 58 at 1:28 AM on day 6, max HR in sinus rhythm 104 at 4:27 PM on day 3.  Average HR 72.   Patient remained bradycardic 1.5%  of total time.   Patient remained tachycardic 0.2% of total time.   Few supraventricular ectopic beats noted representing  <1% of total beats.   5 runs of narrow complex tachycardia noted.  Patient remained asymptomatic during those episodes.  The longest episode 7 beat run with HR 129 at 3:32 AM on day 3.  The fastest episode was 3 beat run with heart rate 180 bpm at 11:16 AM on day 4.   Only 579 PVCs noted representing 0.1 % of total beats.  12 PVCs noted in bigeminal pattern, 156 PVCs noted in trigeminal pattern.   6 patient triggered episodes were recorded.  During those episodes strips revealed sinus rhythm with heart rate 70-83 with no significant arrhythmias noted.   The longest R to R interval 1.1 seconds at 1:28 AM on April 24.   Echo 06/06/22: IMPRESSIONS     1. Left ventricular ejection fraction, by estimation, is 60 to 65%. The  left ventricle has normal function. The left ventricle has no regional  wall motion abnormalities. Left ventricular diastolic parameters are  consistent with Grade  I diastolic  dysfunction (impaired relaxation).   2. Right ventricular systolic function is normal. The right ventricular  size is normal. There is normal pulmonary artery systolic pressure. The  estimated right ventricular systolic pressure is 18.1 mmHg.   3. The mitral valve is grossly normal. Trivial mitral valve  regurgitation. No evidence of mitral stenosis.   4. The aortic valve is tricuspid. Aortic valve regurgitation is not  visualized. No aortic stenosis is present.   5. The inferior vena cava is normal in  size with greater than 50%  respiratory variability, suggesting right atrial pressure of 3 mmHg.   ASSESSMENT AND PLAN:  1.  TIA. Likely posterior per Dr Rosemarie. Now on ASA 2.  HTN BP is elevated today but looking at PCP visits BP has been under good control. Continue  on Bystolic  5 mg daily and lisinopril  5 mg daily.  4. HLD goal LDL < 70. Continue Crestor  5 mg daily and Zetia  10 mg daily. Last LDL 52.   Current medicines are reviewed at length with the patient today.  The patient does not have concerns regarding medicines.  The following changes have been made:  no change  Labs/ tests ordered today include: none  Orders Placed This Encounter  Procedures   EKG 12-Lead     Disposition:   FU with us  in one year   Signed, Bruchy Mikel, MD  01/27/2024 11:08 AM    [facility] [address] [place], [place], [address] Phone [phone], Fax [phone]   "

## 2024-01-27 ENCOUNTER — Ambulatory Visit: Attending: Cardiology | Admitting: Cardiology

## 2024-01-27 ENCOUNTER — Encounter: Payer: Self-pay | Admitting: Cardiology

## 2024-01-27 VITALS — BP 142/80 | HR 69 | Ht 60.0 in | Wt 107.0 lb

## 2024-01-27 DIAGNOSIS — E7800 Pure hypercholesterolemia, unspecified: Secondary | ICD-10-CM | POA: Diagnosis present

## 2024-01-27 DIAGNOSIS — I10 Essential (primary) hypertension: Secondary | ICD-10-CM | POA: Diagnosis present

## 2024-01-27 MED ORDER — EZETIMIBE 10 MG PO TABS: 10.0000 mg | ORAL_TABLET | Freq: Every day | ORAL | 3 refills | Status: AC

## 2024-01-27 MED ORDER — ROSUVASTATIN CALCIUM 5 MG PO TABS: 5.0000 mg | ORAL_TABLET | Freq: Every day | ORAL | 3 refills | Status: AC

## 2024-01-27 MED ORDER — NEBIVOLOL HCL 5 MG PO TABS: 5.0000 mg | ORAL_TABLET | Freq: Every day | ORAL | 3 refills | Status: AC

## 2024-01-27 NOTE — Patient Instructions (Addendum)

## 2024-05-29 ENCOUNTER — Ambulatory Visit: Admitting: Neurology
